# Patient Record
Sex: Male | Born: 1960
Health system: Southern US, Community
[De-identification: ages and names within clinical notes are randomized; demographics above are authoritative.]

## PROBLEM LIST (undated history)

## (undated) DIAGNOSIS — R112 Nausea with vomiting, unspecified: Secondary | ICD-10-CM

## (undated) DIAGNOSIS — Z9889 Other specified postprocedural states: Secondary | ICD-10-CM

## (undated) DIAGNOSIS — F32A Depression, unspecified: Secondary | ICD-10-CM

## (undated) DIAGNOSIS — G4700 Insomnia, unspecified: Secondary | ICD-10-CM

## (undated) DIAGNOSIS — M549 Dorsalgia, unspecified: Secondary | ICD-10-CM

## (undated) DIAGNOSIS — F419 Anxiety disorder, unspecified: Secondary | ICD-10-CM

## (undated) DIAGNOSIS — E079 Disorder of thyroid, unspecified: Secondary | ICD-10-CM

## (undated) HISTORY — PX: VASECTOMY: SHX75

## (undated) HISTORY — PX: COLONOSCOPY: SHX174

## (undated) HISTORY — DX: Anxiety disorder, unspecified: F41.9

## (undated) HISTORY — DX: Disorder of thyroid, unspecified: E07.9

## (undated) HISTORY — DX: Depression, unspecified: F32.A

## (undated) HISTORY — DX: Insomnia, unspecified: G47.00

---

## 2003-01-28 ENCOUNTER — Encounter: Payer: Self-pay | Admitting: Internal Medicine

## 2003-01-28 ENCOUNTER — Encounter: Admission: RE | Admit: 2003-01-28 | Discharge: 2003-01-28 | Payer: Self-pay | Admitting: Internal Medicine

## 2003-11-19 ENCOUNTER — Emergency Department (HOSPITAL_COMMUNITY): Admission: EM | Admit: 2003-11-19 | Discharge: 2003-11-19 | Payer: Self-pay | Admitting: Emergency Medicine

## 2003-12-27 ENCOUNTER — Ambulatory Visit (HOSPITAL_COMMUNITY): Admission: RE | Admit: 2003-12-27 | Discharge: 2003-12-28 | Payer: Self-pay | Admitting: Orthopaedic Surgery

## 2006-05-28 HISTORY — PX: BACK SURGERY: SHX140

## 2007-06-27 ENCOUNTER — Ambulatory Visit: Payer: Self-pay | Admitting: Gastroenterology

## 2007-06-27 LAB — CONVERTED CEMR LAB
ALT: 36 units/L (ref 0–53)
AST: 33 units/L (ref 0–37)
Albumin: 3.8 g/dL (ref 3.5–5.2)
Alkaline Phosphatase: 77 units/L (ref 39–117)
BUN: 12 mg/dL (ref 6–23)
Bilirubin, Direct: 0.2 mg/dL (ref 0.0–0.3)
Calcium: 9.6 mg/dL (ref 8.4–10.5)
Chloride: 104 meq/L (ref 96–112)
Eosinophils Absolute: 0.2 10*3/uL (ref 0.0–0.6)
Eosinophils Relative: 3.3 % (ref 0.0–5.0)
GFR calc Af Amer: 84 mL/min
GFR calc non Af Amer: 69 mL/min
Glucose, Bld: 96 mg/dL (ref 70–99)
MCV: 90.1 fL (ref 78.0–100.0)
Monocytes Relative: 9.5 % (ref 3.0–11.0)
Neutro Abs: 4.1 10*3/uL (ref 1.4–7.7)
Platelets: 204 10*3/uL (ref 150–400)
RBC: 5.29 M/uL (ref 4.22–5.81)
WBC: 7.1 10*3/uL (ref 4.5–10.5)

## 2007-07-01 ENCOUNTER — Ambulatory Visit (HOSPITAL_COMMUNITY): Admission: RE | Admit: 2007-07-01 | Discharge: 2007-07-01 | Payer: Self-pay | Admitting: Gastroenterology

## 2007-07-03 ENCOUNTER — Ambulatory Visit: Payer: Self-pay | Admitting: Cardiology

## 2007-07-09 ENCOUNTER — Ambulatory Visit: Payer: Self-pay | Admitting: Gastroenterology

## 2007-07-09 ENCOUNTER — Encounter: Payer: Self-pay | Admitting: Gastroenterology

## 2010-10-10 NOTE — Assessment & Plan Note (Signed)
[facility]                            CARDIOLOGY OFFICE NOTE   NAME:Leiker, CORT DRAGOO                         MRN:          [id number]  DATE:07/03/2007                            DOB:          Jun 08, 1960    Mr. Kahrs is a 50 year old male who presents for evaluation of chest pain  and for review of risk factors.  He is no prior cardiac history.  Note  he exercise routinely on the elliptical trainer.  He typically does not  have dyspnea on exertion, orthopnea, PND, pedal edema, palpitations,  presyncope, syncope or exertional chest pain.  He does state he  occasionally feels some discomfort his chest after lying flat on all  night.  There is a soreness.  It is not radiating is not otherwise  present.  Because of the above we were asked to further evaluate.  He  also wanted to have his risk factors reviewed.  His past medical  history.  There is no diabetes mellitus, hypertension.  States  cholesterol runs of the 110-120 range.  He has had some problems  constipation and seeing gastroenterology at present.  He has had an anal  fissure.  He has had previous back surgery.  He has no known drug  allergies.  His family history is significant for his father who had  three myocardial infarctions at age 27.   SOCIAL HISTORY:  Does not smoke nor does he consume alcohol.   REVIEW OF SYSTEMS:  He denies any headaches or fevers, chills.  Has no  productive cough or hemoptysis.  There is no dysphagia, odynophagia or  melena.  He has had hematochezia but has been attributes to anal fissure  and undergoing colonoscopy near future for this issue.  Note he has had  some constipation.  There is no dysuria inch of his or seizure activity.  There is no orthopnea, PND or pedal edema.  The remaining systems are  negative.   PHYSICAL EXAM:  Today shows a blood pressure of 125/88 and his pulse 67.  Weighs 319 pounds.  He is well-developed and obese.  He is no acute distress.   Skin is warm,  dry.  Not depressed.  There is no peripheral clubbing.  BACK:  Normal.  His HEENT is normal with normal eyelids.  Neck is supple with normal upstroke bilaterally.  I cannot appreciate  bruits.  There is no jugular distention and no thyromegaly is noted.  CHEST:  Clear.  Normal expansion.  CARDIOVASCULAR:  Regular rhythm.  Normal S1-S2.  No murmurs, rubs or  gallops noted.  There is no change Valsalva.  ABDOMEN:  Exam nontender, and possible sounds.  No mass appreciated.  There is no abdominal bruit.  He has 2+ femoral pulses bilaterally.  No  bruits.  EXTREMITIES:  Shows no edema.  No cords.  2+ posterior tibial pulses  bilaterally.  NEUROLOGIC:  Grossly intact.   His electrocardiogram today shows a sinus rhythm at a rate of 60.  The  axis is normal.  No ST changes noted.   DIAGNOSES:  1. Vague  atypical chest pain - this occurs only with lying on his      chest is musculoskeletal.  Do not think this requires further      cardiac workup.  I did offer stress test today as the patient was      concerned about family history.  However, he has no other risk      factors including no diabetes, hypertension, hyperlipidemia or      tobacco abuse.  His electrocardiogram is normal.  He also exercises      routinely with no symptoms.  I therefore do not think he needs      further workup and he has declined offer of the treadmill.  2. Obesity - we discussed the importance of diet and exercise to help      with his weight loss.  He will continue with risk factor      modification.  We will see him back on as-needed basis.     Madolyn Frieze Jens Som, MD, [facility]  Electronically Signed    BSC/MedQ  DD: 07/03/2007  DT: 07/04/2007  Job #: [id number]   cc:   Tammy R. Collins Scotland, M.D.

## 2010-10-10 NOTE — Assessment & Plan Note (Signed)
[facility]                         GASTROENTEROLOGY OFFICE NOTE   NAME:Bean, Miguel BRICKLE                         MRN:          [id number]  DATE:06/27/2007                            DOB:          13-Nov-1960    Miguel Bean is a 50 year old white male, referred through the courtesy of  Dr. Collins Scotland for recent guaiac-positive stools and intermittent rectal  bleeding.   Christina has had intermittent rectal bleeding for the last year and has  been diagnosed previously as having an anal fissure.  He also has  chronic functional constipation, has been on MiraLax and p.r.n. anal  creams for the last year and has been doing well, without  symptomatology, except for occasional bright red blood per rectum and  some mucus in his stool.  Recent examination in Dr. Alda Berthold clinic  showed a guaiac-positive stool, but otherwise negative rectal exam.   The patient currently is on a high-fiber diet and is doing fairly well  with his bowel movements.  He denies GI symptomatology.  He denies  significant acid reflux, any history of melena, or any hepatobiliary  complaints, anorexia, weight-loss, or any specific food intolerances.  He does work very irregular hours and has some problems eating on a  regular basis.  He has never had colonoscopy, endoscopy or barium  studies of his gastrointestinal tract.   PAST MEDICAL HISTORY:  Surprisingly unremarkable, except for a history  of laminectomy by Dr. Annell Greening in August of 2005.   MEDICATIONS:  1. Folic acid daily.  2. Synthroid for chronic thyroid dysfunction, 25 mcg a day.  3. MiraLax p.r.n.   ALLERGIES:  He denies drug allergies.   FAMILY HISTORY:  Remarkable for sigmoid resection for some type of  unknown gastrointestinal problem in his father.   SOCIAL HISTORY:  He is married, lives with his wife and works as an  Art gallery manager.  He has a high school education.  He does not smoke or abuse  ethanol.   REVIEW OF SYSTEMS:   Otherwise noncontributory, without any current  cardiovascular, pulmonary, genitourinary, neurologic, orthopedic,  endocrine or psychiatric difficulties.   EXAM:  He is a healthy-appearing white male in no distress, appearing  his stated age.  He is 6 feet 1 inch tall and weighs 318 pounds.  Blood pressure is  116/74 and pulse was 64 and regular.  I could not appreciate stigmata of chronic liver disease.  ABDOMEN:  Obese and there was definite organomegaly with enlarged liver  in the right upper quadrant, but no definite splenomegaly, other  abdominal masses or tenderness.  Bowel sounds were normal.  Inspection of the rectum was unremarkable.  I did not repeat his digital  exam.  MENTAL STATUS:  Clear.   ASSESSMENT:  1. Chronic functional constipation and use of MiraLax in a patient who      does have chronic thyroid dysfunction.  2. Intermittent rectal bleeding.  Rule out colon polyps, versus      internal hemorrhoids, and recurrent bleeding rectal fissures.  3. Chronic thyroid dysfunction.  4. Rather massive obesity and probable fatty infiltration  of the      liver.   RECOMMENDATIONS:  1. Outpatient upper abdominal ultrasound exam.  2. Outpatient colonoscopy at his convenience.  3. Check CBC and liver profile.  4. Continue other medications as per Dr. Collins Scotland.     Vania Rea. Jarold Motto, MD, [facility], FAGA  Electronically Signed    DRP/MedQ  DD: 06/27/2007  DT: 06/27/2007  Job #: [id number]   cc:   Tammy R. Collins Scotland, M.D.

## 2010-10-13 NOTE — Op Note (Signed)
NAME:  Miguel Bean, Miguel Bean                            ACCOUNT NO.:  [id number]   MEDICAL RECORD NO.:  [id number]                   PATIENT TYPE:  OIB   LOCATION:  2899                                 FACILITY:  [facility]   PHYSICIAN:  Mark C. Ophelia Charter, M.D.                 DATE OF BIRTH:  [birth year]   DATE OF PROCEDURE:  12/27/2003  DATE OF DISCHARGE:                                 OPERATIVE REPORT   PREOPERATIVE DIAGNOSIS:  Left L4-5 herniated nucleus pulposus with free  fragment.   POSTOPERATIVE DIAGNOSIS:  Left L4-5 herniated nucleus pulposus with free  fragment.   OPERATION PERFORMED:  Left L4-5 microdiskectomy with removal of free  fragment.   SURGEON:  Mark C. Ophelia Charter, M.D.   ASSISTANT:  Sandrea Matte, P.A.   ANESTHESIA:  General orotracheal anesthesia.   ESTIMATED BLOOD LOSS:  200 mL.   DRAINS:  None.   DESCRIPTION OF PROCEDURE:  After induction of general anesthesia,  orotracheal intubation, placed upon Andrews frame.  Preoperative Ancef  prophylaxis.  Back was prepped with DuraPrep.  Area was squared with towels.  Betadine Vi-drape applied and laminectomy sheet and drapes were applied.  Needle localization with spinal needle.  X-ray was taken showing the needle  was just above the 4-5 space.  The patient weighed 300 pounds and required a  double exposure.  Incision was made starting at the needle extending  distally, just left of the spinous processes by a few millimeters.  Dissection through several inches of adipose tissue was performed until the  fascia was identified.  This was released, periosteally stripped and an  extra deep Taylor retractor was placed laterally.  Laminotomy was performed.  Top portion of the L5 on the left was taken and large fragment was  identified which was in the axilla and a fibrous pocket with the disk  material  being very friable, easily removed with the suction.  Several  large pieces were teased out until the sac was completely removed.   There  was some underneath the nerve root as it exited the foramina which also had  to be removed and then finally the shoulder of the nerve root was able to be  dissected and the nerve root pulled toward the midline and removed with some  additional fragments from above the nerve root.  Passes were made through  the disk space with up and down straight pituitaries.  Once the dura was  well decompressed, hockey stick could be passed anterior to the dura 180  degrees with no areas of compression, no remaining free fragment was  present.  The pocket where it sat against the body of L5 was completely  visualized.  The nerve root was completely free and a hockey stick could be  passed up the foramina with no areas of compression.  The lateral gutter out  to the pedicle was smooth.  Disk  space then irrigated with saline solution,  0 Vicryl in the deep fascia, 2-0 Vicryl in the subcutaneous tissue.  Skin  staple closure.  Marcaine infiltration, Adaptic, 4 x 4s and tape.  Instrument and needle counts were correct.                                               Mark C. Ophelia Charter, M.D.   MCY/MEDQ  D:  12/27/2003  T:  12/27/2003  Job:  [id number]

## 2011-06-07 ENCOUNTER — Encounter (HOSPITAL_COMMUNITY): Payer: Self-pay | Admitting: Respiratory Therapy

## 2011-06-11 ENCOUNTER — Other Ambulatory Visit (HOSPITAL_COMMUNITY): Payer: Self-pay | Admitting: Orthopaedic Surgery

## 2011-06-15 ENCOUNTER — Encounter (HOSPITAL_COMMUNITY): Payer: Self-pay

## 2011-06-15 ENCOUNTER — Encounter (HOSPITAL_COMMUNITY)
Admission: RE | Admit: 2011-06-15 | Discharge: 2011-06-15 | Disposition: A | Discharge status: Home / Self Care | Payer: 59 | Source: Ambulatory Visit | Attending: Orthopaedic Surgery | Admitting: Orthopaedic Surgery

## 2011-06-15 ENCOUNTER — Other Ambulatory Visit: Payer: Self-pay

## 2011-06-15 HISTORY — DX: Other specified postprocedural states: Z98.890

## 2011-06-15 HISTORY — DX: Other specified postprocedural states: R11.2

## 2011-06-15 LAB — COMPREHENSIVE METABOLIC PANEL
ALT: 18 U/L (ref 0–53)
BUN: 14 mg/dL (ref 6–23)
CO2: 27 mEq/L (ref 19–32)
Calcium: 9.3 mg/dL (ref 8.4–10.5)
Creatinine, Ser: 0.87 mg/dL (ref 0.50–1.35)
GFR calc Af Amer: >90 mL/min (ref >90)
GFR calc non Af Amer: >90 mL/min (ref >90)
Glucose, Bld: 75 mg/dL (ref 70–99)
Sodium: 141 mEq/L (ref 135–145)
Total Protein: 6.7 g/dL (ref 6.0–8.3)

## 2011-06-15 LAB — CBC
HCT: 45.2 % (ref 39.0–52.0)
Hemoglobin: 15.7 g/dL (ref 13.0–17.0)
MCH: 31.5 pg (ref 26.0–34.0)
MCHC: 34.7 g/dL (ref 30.0–36.0)
MCV: 90.8 fL (ref 78.0–100.0)
RBC: 4.98 MIL/uL (ref 4.22–5.81)

## 2011-06-15 LAB — SURGICAL PCR SCREEN: MRSA, PCR: NEGATIVE

## 2011-06-15 NOTE — Pre-Procedure Instructions (Addendum)
[address] Karbowski  06/15/2011   Your procedure is scheduled on:  06/22/11  Report to [facility] at530 AM.  Call this number if you have problems the morning of surgery: [phone]   Remember:   Do not eat food:After Midnight.  May have clear liquids: up to 4 Hours before arrival.  Clear liquids include soda, tea, black coffee, apple or grape juice, broth.  Take these medicines the morning of surgery with A SIP OF WATER:   Do not wear jewelry, make-up or nail polish.  Do not wear lotions, powders, or perfumes. You may wear deodorant.  Do not shave 48 hours prior to surgery.  Do not bring valuables to the hospital.  Contacts, dentures or bridgework may not be worn into surgery.  Leave suitcase in the car. After surgery it may be brought to your room.  For patients admitted to the hospital, checkout time is 11:00 AM the day of discharge.   Patients discharged the day of surgery will not be allowed to drive home.  Name and phone number of your driver:brenda [phone]  Special Instructions: CHG Shower Use Special Wash: 1/2 bottle night before surgery and 1/2 bottle morning of surgery.   Please read over the following fact sheets that you were given: Pain Booklet, Coughing and Deep Breathing, MRSA Information and Surgical Site Infection Prevention

## 2011-06-18 LAB — URINALYSIS, ROUTINE W REFLEX MICROSCOPIC
Glucose, UA: NEGATIVE mg/dL
Hgb urine dipstick: NEGATIVE
Ketones, ur: NEGATIVE mg/dL
Leukocytes, UA: NEGATIVE
Protein, ur: NEGATIVE mg/dL
Urobilinogen, UA: 0.2 mg/dL (ref 0.0–1.0)

## 2011-06-21 MED ORDER — CEFAZOLIN SODIUM-DEXTROSE 2-3 GM-% IV SOLR
2.0000 g | INTRAVENOUS | Status: AC
  Administered 2011-06-22: 2 g via INTRAVENOUS
  Filled 2011-06-21: qty 50

## 2011-06-22 ENCOUNTER — Encounter (HOSPITAL_COMMUNITY)
Admission: RE | Disposition: A | Discharge status: Home / Self Care | Payer: Self-pay | Source: Ambulatory Visit | Attending: Orthopaedic Surgery

## 2011-06-22 ENCOUNTER — Encounter (HOSPITAL_COMMUNITY): Payer: Self-pay | Admitting: *Deleted

## 2011-06-22 ENCOUNTER — Encounter (HOSPITAL_COMMUNITY): Payer: Self-pay | Admitting: Anesthesiology

## 2011-06-22 ENCOUNTER — Ambulatory Visit (HOSPITAL_COMMUNITY): Payer: 59

## 2011-06-22 ENCOUNTER — Ambulatory Visit (HOSPITAL_COMMUNITY)
Admission: RE | Admit: 2011-06-22 | Discharge: 2011-06-23 | Disposition: A | Discharge status: Home / Self Care | Payer: 59 | Source: Ambulatory Visit | Attending: Orthopaedic Surgery | Admitting: Orthopaedic Surgery

## 2011-06-22 ENCOUNTER — Ambulatory Visit (HOSPITAL_COMMUNITY): Payer: 59 | Admitting: Anesthesiology

## 2011-06-22 DIAGNOSIS — Z01818 Encounter for other preprocedural examination: Secondary | ICD-10-CM | POA: Insufficient documentation

## 2011-06-22 DIAGNOSIS — Z01812 Encounter for preprocedural laboratory examination: Secondary | ICD-10-CM | POA: Insufficient documentation

## 2011-06-22 DIAGNOSIS — M5126 Other intervertebral disc displacement, lumbar region: Secondary | ICD-10-CM

## 2011-06-22 DIAGNOSIS — Z01810 Encounter for preprocedural cardiovascular examination: Secondary | ICD-10-CM | POA: Insufficient documentation

## 2011-06-22 HISTORY — DX: Dorsalgia, unspecified: M54.9

## 2011-06-22 HISTORY — PX: LUMBAR LAMINECTOMY: SHX95

## 2011-06-22 SURGERY — MICRODISCECTOMY LUMBAR LAMINECTOMY
Anesthesia: General | Site: Back | Laterality: Left | Wound class: Clean

## 2011-06-22 MED ORDER — SODIUM CHLORIDE 0.9 % IJ SOLN: 3.0000 mL | INTRAMUSCULAR | Status: DC | PRN

## 2011-06-22 MED ORDER — SODIUM CHLORIDE 0.9 % IV SOLN: 250.0000 mL | INTRAVENOUS | Status: DC

## 2011-06-22 MED ORDER — MENTHOL 3 MG MT LOZG: 1.0000 | LOZENGE | OROMUCOSAL | Status: DC | PRN

## 2011-06-22 MED ORDER — MIDAZOLAM HCL 5 MG/5ML IJ SOLN
INTRAMUSCULAR | Status: DC | PRN
  Administered 2011-06-22 (×2): 1 mg via INTRAVENOUS

## 2011-06-22 MED ORDER — BISACODYL 10 MG RE SUPP: 10.0000 mg | Freq: Every day | RECTAL | Status: DC | PRN

## 2011-06-22 MED ORDER — HYDROCODONE-ACETAMINOPHEN 5-325 MG PO TABS: 1.0000 | ORAL_TABLET | ORAL | Status: DC | PRN

## 2011-06-22 MED ORDER — SODIUM CHLORIDE 0.9 % IJ SOLN
3.0000 mL | Freq: Two times a day (BID) | INTRAMUSCULAR | Status: DC
  Administered 2011-06-22 – 2011-06-23 (×2): 3 mL via INTRAVENOUS

## 2011-06-22 MED ORDER — METHOCARBAMOL 500 MG PO TABS: 500.0000 mg | ORAL_TABLET | Freq: Four times a day (QID) | ORAL | Status: DC | PRN

## 2011-06-22 MED ORDER — ONDANSETRON HCL 4 MG/2ML IJ SOLN
INTRAMUSCULAR | Status: DC | PRN
  Administered 2011-06-22: 4 mg via INTRAVENOUS

## 2011-06-22 MED ORDER — ALUM & MAG HYDROXIDE-SIMETH 200-200-20 MG/5ML PO SUSP: 30.0000 mL | Freq: Four times a day (QID) | ORAL | Status: DC | PRN

## 2011-06-22 MED ORDER — PROPOFOL 10 MG/ML IV EMUL
INTRAVENOUS | Status: DC | PRN
  Administered 2011-06-22: 200 mg via INTRAVENOUS

## 2011-06-22 MED ORDER — ACETAMINOPHEN 325 MG PO TABS: 650.0000 mg | ORAL_TABLET | ORAL | Status: DC | PRN

## 2011-06-22 MED ORDER — BUPIVACAINE HCL (PF) 0.25 % IJ SOLN
INTRAMUSCULAR | Status: DC | PRN
  Administered 2011-06-22: 6 mL

## 2011-06-22 MED ORDER — NEOSTIGMINE METHYLSULFATE 1 MG/ML IJ SOLN
INTRAMUSCULAR | Status: DC | PRN
  Administered 2011-06-22: 3 mg via INTRAVENOUS

## 2011-06-22 MED ORDER — HYDROMORPHONE HCL PF 1 MG/ML IJ SOLN: 0.2500 mg | INTRAMUSCULAR | Status: DC | PRN

## 2011-06-22 MED ORDER — OXYCODONE-ACETAMINOPHEN 5-325 MG PO TABS: 1.0000 | ORAL_TABLET | ORAL | Status: DC | PRN

## 2011-06-22 MED ORDER — KETOROLAC TROMETHAMINE 30 MG/ML IJ SOLN
30.0000 mg | Freq: Four times a day (QID) | INTRAMUSCULAR | Status: DC
  Administered 2011-06-22 – 2011-06-23 (×3): 30 mg via INTRAVENOUS
  Filled 2011-06-22 (×7): qty 1

## 2011-06-22 MED ORDER — PHENYLEPHRINE HCL 10 MG/ML IJ SOLN
10.0000 mg | INTRAVENOUS | Status: DC | PRN
  Administered 2011-06-22: 20 ug/min via INTRAVENOUS

## 2011-06-22 MED ORDER — ACETAMINOPHEN 650 MG RE SUPP: 650.0000 mg | RECTAL | Status: DC | PRN

## 2011-06-22 MED ORDER — ONDANSETRON HCL 4 MG/2ML IJ SOLN: 4.0000 mg | Freq: Four times a day (QID) | INTRAMUSCULAR | Status: DC | PRN

## 2011-06-22 MED ORDER — PHENOL 1.4 % MT LIQD
1.0000 | OROMUCOSAL | Status: DC | PRN
  Filled 2011-06-22: qty 177

## 2011-06-22 MED ORDER — ONDANSETRON HCL 4 MG/2ML IJ SOLN: 4.0000 mg | INTRAMUSCULAR | Status: DC | PRN

## 2011-06-22 MED ORDER — DEXTROSE 5 % IV SOLN
INTRAVENOUS | Status: DC | PRN
  Administered 2011-06-22: 07:00:00 via INTRAVENOUS

## 2011-06-22 MED ORDER — DOCUSATE SODIUM 100 MG PO CAPS
100.0000 mg | ORAL_CAPSULE | Freq: Two times a day (BID) | ORAL | Status: DC
  Administered 2011-06-22 – 2011-06-23 (×3): 100 mg via ORAL
  Filled 2011-06-22 (×3): qty 1

## 2011-06-22 MED ORDER — EPHEDRINE SULFATE 50 MG/ML IJ SOLN
INTRAMUSCULAR | Status: DC | PRN
  Administered 2011-06-22 (×3): 5 mg via INTRAVENOUS

## 2011-06-22 MED ORDER — 0.9 % SODIUM CHLORIDE (POUR BTL) OPTIME
TOPICAL | Status: DC | PRN
  Administered 2011-06-22: 1000 mL

## 2011-06-22 MED ORDER — DEXAMETHASONE SODIUM PHOSPHATE 10 MG/ML IJ SOLN
INTRAMUSCULAR | Status: DC | PRN
  Administered 2011-06-22: 10 mg via INTRAVENOUS

## 2011-06-22 MED ORDER — SENNOSIDES-DOCUSATE SODIUM 8.6-50 MG PO TABS: 1.0000 | ORAL_TABLET | Freq: Every evening | ORAL | Status: DC | PRN

## 2011-06-22 MED ORDER — ZOLPIDEM TARTRATE 10 MG PO TABS: 10.0000 mg | ORAL_TABLET | Freq: Every evening | ORAL | Status: DC | PRN

## 2011-06-22 MED ORDER — ROCURONIUM BROMIDE 100 MG/10ML IV SOLN
INTRAVENOUS | Status: DC | PRN
  Administered 2011-06-22 (×2): 10 mg via INTRAVENOUS
  Administered 2011-06-22: 50 mg via INTRAVENOUS

## 2011-06-22 MED ORDER — GLYCOPYRROLATE 0.2 MG/ML IJ SOLN
INTRAMUSCULAR | Status: DC | PRN
  Administered 2011-06-22: .4 mg via INTRAVENOUS

## 2011-06-22 MED ORDER — MORPHINE SULFATE 4 MG/ML IJ SOLN: 1.0000 mg | INTRAMUSCULAR | Status: DC | PRN

## 2011-06-22 MED ORDER — CEFAZOLIN SODIUM 1-5 GM-% IV SOLN
1.0000 g | Freq: Three times a day (TID) | INTRAVENOUS | Status: AC
  Administered 2011-06-22 (×2): 1 g via INTRAVENOUS
  Filled 2011-06-22 (×2): qty 50

## 2011-06-22 MED ORDER — LACTATED RINGERS IV SOLN
INTRAVENOUS | Status: DC | PRN
  Administered 2011-06-22 (×2): via INTRAVENOUS

## 2011-06-22 MED ORDER — METHOCARBAMOL 100 MG/ML IJ SOLN
500.0000 mg | Freq: Four times a day (QID) | INTRAVENOUS | Status: DC | PRN
  Filled 2011-06-22: qty 5

## 2011-06-22 MED ORDER — KCL IN DEXTROSE-NACL 20-5-0.45 MEQ/L-%-% IV SOLN
INTRAVENOUS | Status: DC
  Administered 2011-06-22: 75 mL/h via INTRAVENOUS
  Filled 2011-06-22 (×5): qty 1000

## 2011-06-22 MED ORDER — FENTANYL CITRATE 0.05 MG/ML IJ SOLN
INTRAMUSCULAR | Status: DC | PRN
  Administered 2011-06-22: 100 ug via INTRAVENOUS
  Administered 2011-06-22: 50 ug via INTRAVENOUS

## 2011-06-22 SURGICAL SUPPLY — 52 items
BENZOIN TINCTURE PRP APPL 2/3 (GAUZE/BANDAGES/DRESSINGS) ×2 IMPLANT
BUR ROUND FLUTED 4 SOFT TCH (BURR) IMPLANT
CLOTH BEACON ORANGE TIMEOUT ST (SAFETY) ×2 IMPLANT
CORDS BIPOLAR (ELECTRODE) ×2 IMPLANT
COVER SURGICAL LIGHT HANDLE (MISCELLANEOUS) ×2 IMPLANT
DRAPE MICROSCOPE LEICA (MISCELLANEOUS) ×2 IMPLANT
DRAPE PROXIMA HALF (DRAPES) IMPLANT
DRSG EMULSION OIL 3X3 NADH (GAUZE/BANDAGES/DRESSINGS) IMPLANT
DURAPREP 26ML APPLICATOR (WOUND CARE) ×2 IMPLANT
ELECT REM PT RETURN 9FT ADLT (ELECTROSURGICAL) ×2
ELECTRODE REM PT RTRN 9FT ADLT (ELECTROSURGICAL) ×1 IMPLANT
GAUZE SPONGE 4X4 12PLY STRL LF (GAUZE/BANDAGES/DRESSINGS) ×2 IMPLANT
GLOVE BIOGEL PI IND STRL 6.5 (GLOVE) ×1 IMPLANT
GLOVE BIOGEL PI IND STRL 7.5 (GLOVE) ×1 IMPLANT
GLOVE BIOGEL PI IND STRL 8 (GLOVE) ×1 IMPLANT
GLOVE BIOGEL PI INDICATOR 6.5 (GLOVE) ×1
GLOVE BIOGEL PI INDICATOR 7.5 (GLOVE) ×1
GLOVE BIOGEL PI INDICATOR 8 (GLOVE) ×1
GLOVE ECLIPSE 7.0 STRL STRAW (GLOVE) ×2 IMPLANT
GLOVE ORTHO TXT STRL SZ7.5 (GLOVE) ×4 IMPLANT
GLOVE SURG SS PI 6.5 STRL IVOR (GLOVE) ×2 IMPLANT
GOWN BRE IMP SLV AUR XL STRL (GOWN DISPOSABLE) ×4 IMPLANT
GOWN PREVENTION PLUS LG XLONG (DISPOSABLE) IMPLANT
GOWN STRL NON-REIN LRG LVL3 (GOWN DISPOSABLE) ×2 IMPLANT
KIT BASIN OR (CUSTOM PROCEDURE TRAY) ×2 IMPLANT
KIT ROOM TURNOVER OR (KITS) ×2 IMPLANT
MANIFOLD NEPTUNE II (INSTRUMENTS) ×2 IMPLANT
NDL SUT .5 MAYO 1.404X.05X (NEEDLE) ×1 IMPLANT
NEEDLE HYPO 25GX1X1/2 BEV (NEEDLE) ×2 IMPLANT
NEEDLE MAYO TAPER (NEEDLE) ×1
NEEDLE SPNL 18GX3.5 QUINCKE PK (NEEDLE) ×2 IMPLANT
NS IRRIG 1000ML POUR BTL (IV SOLUTION) ×2 IMPLANT
PACK LAMINECTOMY ORTHO (CUSTOM PROCEDURE TRAY) ×2 IMPLANT
PAD ARMBOARD 7.5X6 YLW CONV (MISCELLANEOUS) ×4 IMPLANT
PATTIES SURGICAL .5 X.5 (GAUZE/BANDAGES/DRESSINGS) IMPLANT
PATTIES SURGICAL .75X.75 (GAUZE/BANDAGES/DRESSINGS) IMPLANT
SPONGE GAUZE 4X4 12PLY (GAUZE/BANDAGES/DRESSINGS) IMPLANT
STAPLER VISISTAT 35W (STAPLE) IMPLANT
STRIP CLOSURE SKIN 1/2X4 (GAUZE/BANDAGES/DRESSINGS) ×2 IMPLANT
SUCTION FRAZIER TIP 10 FR DISP (SUCTIONS) ×2 IMPLANT
SUT VIC AB 0 CT1 27 (SUTURE) ×1
SUT VIC AB 0 CT1 27XBRD ANBCTR (SUTURE) ×1 IMPLANT
SUT VIC AB 2-0 CT1 27 (SUTURE)
SUT VIC AB 2-0 CT1 TAPERPNT 27 (SUTURE) IMPLANT
SUT VICRYL 0 TIES 12 18 (SUTURE) ×2 IMPLANT
SUT VICRYL 4-0 PS2 18IN ABS (SUTURE) IMPLANT
SUT VICRYL AB 2 0 TIES (SUTURE) IMPLANT
SYR 20ML ECCENTRIC (SYRINGE) IMPLANT
SYR CONTROL 10ML LL (SYRINGE) ×2 IMPLANT
TOWEL OR 17X24 6PK STRL BLUE (TOWEL DISPOSABLE) ×2 IMPLANT
TOWEL OR 17X26 10 PK STRL BLUE (TOWEL DISPOSABLE) ×2 IMPLANT
WATER STERILE IRR 1000ML POUR (IV SOLUTION) IMPLANT

## 2011-06-22 NOTE — Anesthesia Preprocedure Evaluation (Addendum)
Anesthesia Evaluation  Patient identified by MRN, date of birth, ID band Patient awake    Reviewed: Allergy & Precautions, H&P , NPO status , Patient's Chart, lab work & pertinent test results  History of Anesthesia Complications (+) PONV  Airway Mallampati: II  Neck ROM: full    Dental   Pulmonary          Cardiovascular     Neuro/Psych    GI/Hepatic   Endo/Other  Morbid obesity  Renal/GU      Musculoskeletal   Abdominal   Peds  Hematology   Anesthesia Other Findings   Reproductive/Obstetrics                          Anesthesia Physical Anesthesia Plan  ASA: II  Anesthesia Plan: General   Post-op Pain Management:    Induction: Intravenous  Airway Management Planned: Oral ETT  Additional Equipment:   Intra-op Plan:   Post-operative Plan: Extubation in OR  Informed Consent: I have reviewed the patients History and Physical, chart, labs and discussed the procedure including the risks, benefits and alternatives for the proposed anesthesia with the patient or authorized representative who has indicated his/her understanding and acceptance.   Dental advisory given  Plan Discussed with: Anesthesiologist and Surgeon  Anesthesia Plan Comments:         Anesthesia Quick Evaluation

## 2011-06-22 NOTE — Transfer of Care (Signed)
Immediate Anesthesia Transfer of Care Note  Patient: Miguel Bean  Procedure(s) Performed:  MICRODISCECTOMY LUMBAR LAMINECTOMY - Left L5-S1 Microdiscectomy  Patient Location: PACU  Anesthesia Type: General  Level of Consciousness: awake and alert   Airway & Oxygen Therapy: Patient Spontanous Breathing and Patient connected to nasal cannula oxygen  Post-op Assessment: Report given to PACU RN, Post -op Vital signs reviewed and stable and Patient moving all extremities  Post vital signs: Reviewed and stable  Complications: No apparent anesthesia complications

## 2011-06-22 NOTE — Op Note (Signed)
Miguel Bean, Miguel Bean                  ACCOUNT NO.:  [id number]  MEDICAL RECORD NO.:  [id number]  LOCATION:  [facility]                         FACILITY:  [facility]  PHYSICIAN:  Tanis Burnley C. Ophelia Charter, M.D.    DATE OF BIRTH:  [birth year]  DATE OF PROCEDURE:  06/22/2011 DATE OF DISCHARGE:                              OPERATIVE REPORT   PREOPERATIVE DIAGNOSIS:  Left foraminal, extraforaminal herniated nucleus pulposus, L5-S1.  POSTOPERATIVE DIAGNOSIS:  Left foraminal, extraforaminal herniated nucleus pulposus, L5-S1.  PROCEDURE:  Left L5-S1 microdiskectomy.  SURGEON:  Laurencia Roma C. Ophelia Charter, MD.  ASSISTANT:  Maud Deed PA-C, medically necessary and present for the entire procedure.  ANESTHESIA:  GOT plus Marcaine skin local.  ESTIMATED BLOOD LOSS:  Minimal.  This patient has had previous surgery at L4-5 on the left, and has had problems with the back pain, leg pain, leg weakness, failed epidurals, anti-inflammatories, takes narcotic medication, physical therapy with increasing symptoms and L5-S1 enlargement of foraminal, extraforaminal HNP.  He has some degenerative changes at other levels including L4-5 with some disk protrusion, left paracentral that might be responsible portion of his problem.  PROCEDURE:  After induction of general anesthesia and orotracheal intubation, the patient was placed prone on chest rolls.  Standard prepping and draping was performed and the usual sterile towels, sterile skin marker on the old incision, and Betadine Steri-Drape.  Laminectomy sheets and drapes were applied.  Spinal needle was placed at the planned level and confirmed with an x-ray.  Incision was made, subperiosteal dissection, extra deep Taylor retractor had been used due to the patient's large size with thick layer of adipose tissue.  A #4 Penfield was placed at L5-S 1 level.  Second x-ray was taken confirming appropriate level.  Laminotomy was performed.  Ligament was taken down. Disk was bulging and  particularly prominent in the foramina.  There was overhanging facets and the hockey stick would barely fit out laterally on the left in line with the disk due to overhanging facets and the disk bulge.  With anulus incised, I used combination of micro pituitaries down, up biter straight pituitaries and Epstein.  Disk material was carefully pulled back and toward the mid portion of the disk, I grasped and removed.  Undercutting of the facet was performed to enlarge the area.  Bone was removed out to the level of the S1 pedicle and a hockey stick was placed around the S1 nerve root with no areas compression. Continued work in the midline, decompressed the L5-S1 disk little bit more and then continued work out laterally with the hockey stick, pushing fragments in to the air of the disk and then grasped them with the down biting micropituitary and removal.  After irrigation with saline solution, MRI was reviewed again.  With the patient's multilevel degenerative changes, an option to consider doing a hemilaminectomy on the left, taking a look at the L4-5 disk, was discussed.  L4-5 disk had been stable with no change on serial MRIs of the L5-S1 that showed increase of the foraminal, extraforaminal disk herniation with further displacement of the nerve root.  It was elected to stay with the microdiskectomy at the operative L5-S1 level as  planned.  Another level had had surgery before and there was a scar tissue present and absence of progressive compression on sterile MRI, it was felt that more likely the L5-S1 level had been a cause for his symptoms and this corresponded with his response to epidural injections.  Operative field was then irrigated carefully.  Fascia was closed with 0 Vicryl, 2-0 Vicryl, subcutaneous tissue, subcuticular Vicryl for skin closure.  Tincture of benzoin, Marcaine infiltration, Steri-Strips and postoperative dressing. Instrument count and needle count was correct.   The patient tolerated the procedure well and was transferred to recovery room in stable condition.     Amneet Cendejas C. Ophelia Charter, M.D.     MCY/MEDQ  D:  06/22/2011  T:  06/22/2011  Job:  [id number]

## 2011-06-22 NOTE — Brief Op Note (Cosign Needed Addendum)
06/22/2011  9:23 AM  PATIENT:  Miguel Bean  51 y.o. male  PRE-OPERATIVE DIAGNOSIS:  Left L5-S1 herniated nucleus pulposus  POST-OPERATIVE DIAGNOSIS:  Left L5-S1 herniated nucleus pulposus  PROCEDURE:  Procedure(s): MICRODISCECTOMY LUMBAR LAMINECTOMY  SURGEON:  Surgeon(s): Eldred Manges, MD  PHYSICIAN ASSISTANT: Maud Deed PAC  ASSISTANTS: none   ANESTHESIA:   general  EBL:  Total I/O In: 1050 [I.V.:1050] Out: 50 [Blood:50]  BLOOD ADMINISTERED:none  DRAINS: none   LOCAL MEDICATIONS USED:  MARCAINE 10 CC  SPECIMEN:  No Specimen  DISPOSITION OF SPECIMEN:  N/A  COUNTS:  YES  TOURNIQUET:  * No tourniquets in log *  DICTATION: .Note written in EPIC and Other Dictation: Dictation Number [id number]  PLAN OF CARE: Admit for overnight observation  PATIENT DISPOSITION:  PACU - hemodynamically stable.   Delay start of Pharmacological VTE agent (>24hrs) due to surgical blood loss or risk of bleeding:  {YES/NO/NOT APPLICABLE:20182

## 2011-06-22 NOTE — H&P (Signed)
Miguel Bean is an 51 y.o. male.   Chief Complaint: LBP and left pain and weakness  Left L5-S1 HNP with compression and radiculopathy [id number] conservative Tx NSAIDS, muscle relaxant , PT , ESI's narcotic meds pain with ADL's , problems with work secondary to pain and left leg weakness.  Previous lum lam at L4-5  Past Medical History  Diagnosis Date  . PONV (postoperative nausea and vomiting)   . Back pain     Past Surgical History  Procedure Date  . Back surgery 08  . Vasectomy     96    History reviewed. No pertinent family history. Social History:  reports that he has never smoked. He does not have any smokeless tobacco history on file. He reports that he does not drink alcohol or use illicit drugs.  Allergies: No Known Allergies  Medications Prior to Admission  Medication Dose Route Frequency Provider Last Rate Last Dose  . ceFAZolin (ANCEF) IVPB 2 g/50 mL premix  2 g Intravenous 60 min Pre-Op Eldred Manges, MD       No current outpatient prescriptions on file as of 06/22/2011.    No results found for this or any previous visit (from the past 48 hour(s)). No results found.  Review of Systems  Constitutional: Negative for fever and weight loss.  HENT: Negative for hearing loss, congestion and ear discharge.   Eyes: Negative for blurred vision, photophobia and pain.  Respiratory: Negative for hemoptysis.   Cardiovascular: Negative for chest pain and orthopnea.  Gastrointestinal: Negative for heartburn, nausea and vomiting.  Genitourinary: Negative.   Musculoskeletal: Positive for back pain.  Skin: Negative for itching and rash.  Neurological: Positive for tingling.       Lumb lateral and plantar left foot  Psychiatric/Behavioral: Negative.  Negative for depression.    Blood pressure 123/77, pulse 60, temperature 98.4 F (36.9 C), temperature source Oral, resp. rate 18, SpO2 98.00%. Physical Exam  Constitutional: He appears well-developed.  HENT:  Head:  Normocephalic and atraumatic.  Eyes: EOM are normal. Pupils are equal, round, and reactive to light.  Neck: Normal range of motion. Neck supple.  Cardiovascular: Normal rate and regular rhythm.   Respiratory: Effort normal and breath sounds normal.  GI: Soft. Bowel sounds are normal.  Musculoskeletal:       Pos. SLR 45 degrees left , GS weakness left.  MRI HNP L5-S1 left with compression      Assessment/Plan Left L5-S1 HNP with compression failed cons Tx. ESI's , PT , with increasing left leg weakness.  Risks discussed all ?'s answered , he understands and agrees to proceed.   Miguel Bean C 06/22/2011, 7:20 AM

## 2011-06-22 NOTE — Anesthesia Postprocedure Evaluation (Signed)
Anesthesia Post Note  Patient: Miguel Bean  Procedure(s) Performed:  MICRODISCECTOMY LUMBAR LAMINECTOMY - Left L5-S1 Microdiscectomy  Anesthesia type: General  Patient location: PACU  Post pain: Pain level controlled and Adequate analgesia  Post assessment: Post-op Vital signs reviewed, Patient's Cardiovascular Status Stable, Respiratory Function Stable, Patent Airway and Pain level controlled  Last Vitals:  Filed Vitals:   06/22/11 1000  BP:   Pulse: 65  Temp:   Resp: 18    Post vital signs: Reviewed and stable  Level of consciousness: awake, alert  and oriented  Complications: No apparent anesthesia complications

## 2011-06-22 NOTE — Anesthesia Procedure Notes (Signed)
Procedure Name: Intubation Date/Time: 06/22/2011 8:03 AM Performed by: Neomia Dear, Sukhraj Esquivias K Pre-anesthesia Checklist: Timeout performed, Patient identified, Emergency Drugs available, Suction available and Patient being monitored Patient Re-evaluated:Patient Re-evaluated prior to inductionOxygen Delivery Method: Circle System Utilized Preoxygenation: Pre-oxygenation with 100% oxygen Intubation Type: IV induction Ventilation: Mask ventilation without difficulty Laryngoscope Size: Mac and 4 Grade View: Grade II Tube type: Oral Tube size: 8.0 mm Number of attempts: 1 Airway Equipment and Method: stylet Placement Confirmation: ETT inserted through vocal cords under direct vision,  positive ETCO2 and breath sounds checked- equal and bilateral Secured at: 24 cm Tube secured with: Tape Dental Injury: Teeth and Oropharynx as per pre-operative assessment

## 2011-06-22 NOTE — Preoperative (Signed)
Beta Blockers   Reason not to administer Beta Blockers:Not Applicable 

## 2011-06-23 MED ORDER — HYDROCODONE-ACETAMINOPHEN 5-325 MG PO TABS: 1.0000 | ORAL_TABLET | ORAL | Status: AC | PRN

## 2011-06-23 MED ORDER — METHOCARBAMOL 500 MG PO TABS: 500.0000 mg | ORAL_TABLET | Freq: Three times a day (TID) | ORAL | Status: AC

## 2011-06-23 NOTE — Discharge Summary (Signed)
Patient ID: Miguel Bean MRN: [id number] DOB/AGE: [birth year] 51 y.o.  Admit date: 06/22/2011 Discharge date: 06/23/2011  Admission Diagnoses:  Principal Problem:  *HNP (herniated nucleus pulposus), lumbar   Discharge Diagnoses:  Same  Past Medical History  Diagnosis Date  . PONV (postoperative nausea and vomiting)   . Back pain     Surgeries: Procedure(s): MICRODISCECTOMY LUMBAR LAMINECTOMY on 06/22/2011   Consultants:    Discharged Condition: Improved  Hospital Course: Miguel Bean is an 51 y.o. male who was admitted 06/22/2011 for operative treatment ofHNP (herniated nucleus pulposus), lumbar. Patient has severe unremitting pain that affects sleep, daily activities, and work/hobbies. After pre-op clearance the patient was taken to the operating room on 06/22/2011 and underwent  Procedure(s): MICRODISCECTOMY LUMBAR LAMINECTOMY.    Patient was given perioperative antibiotics: Anti-infectives     Start     Dose/Rate Route Frequency Ordered Stop   06/22/11 1530   ceFAZolin (ANCEF) IVPB 1 g/50 mL premix        1 g 100 mL/hr over 30 Minutes Intravenous Every 8 hours 06/22/11 1238 06/22/11 2309   06/21/11 1515   ceFAZolin (ANCEF) IVPB 2 g/50 mL premix        2 g 100 mL/hr over 30 Minutes Intravenous 60 min pre-op 06/21/11 1513 06/22/11 0725           Patient was given sequential compression devices, early ambulation, and chemoprophylaxis to prevent DVT.POD#1 alert oriented. No pain. Leg Pain is gone, no pain meds today. Voiding without difficulty.  Patient benefited maximally from hospital stay and there were no complications.    Recent vital signs: Patient Vitals for the past 24 hrs:  BP Temp Temp src Pulse Resp SpO2 Height Weight  06/23/11 0526 103/62 mmHg 98.9 F (37.2 C) - 69  18  97 % - -  06/22/11 2015 113/61 mmHg 99.5 F (37.5 C) - 60  18  96 % - -  06/22/11 1430 122/44 mmHg 98 F (36.7 C) - 75  18  93 % - -  06/22/11 1210 113/67 mmHg 98.3 F (36.8 C) Oral 66  16   98 % 6' (1.829 m) 117.935 kg (260 lb)  06/22/11 1153 - 98.6 F (37 C) - - - - - -  06/22/11 1145 - - - 74  23  98 % - -  06/22/11 1130 - - - 56  13  99 % - -  06/22/11 1125 124/69 mmHg - - - - - - -     Recent laboratory studies: No results found for this basename: WBC:2,HGB:2,HCT:2,PLT:2,NA:2,K:2,CL:2,CO2:2,BUN:2,CREATININE:2,GLUCOSE:2,PT:2,INR:2,CALCIUM,2: in the last 72 hours   Discharge Medications:   Medication List  As of 06/23/2011 11:16 AM   TAKE these medications         HYDROcodone-acetaminophen 5-325 MG per tablet   Commonly known as: NORCO   Take 1-2 tablets by mouth every 4 (four) hours as needed for pain.      methocarbamol 500 MG tablet   Commonly known as: ROBAXIN   Take 1 tablet (500 mg total) by mouth 3 (three) times daily.            Diagnostic Studies: Dg Chest 2 View  06/15/2011  *RADIOLOGY REPORT*  Clinical Data: Preoperative assessment for lumbar spine surgery  CHEST - 2 VIEW  Comparison: 12/24/2003  Findings: Normal heart size, mediastinal contours, pulmonary vascularity. Minimal chronic peribronchial thickening. No pulmonary filtrate, pleural effusion or pneumothorax. No acute osseous findings.  IMPRESSION: Minimal chronic bronchitic changes.  Original  Report Authenticated By: Lollie Marrow, M.D.   Dg Lumbar Spine 2-3 Views  06/22/2011  *RADIOLOGY REPORT*  Clinical Data: L5 S1 microdiskectomy.  LUMBAR SPINE - 2-3 VIEW  Comparison: MRI 06/02/2011  Findings: First lateral intraoperative image demonstrates posterior surgical instrument directed at the S1-2 level.  Second lateral intraoperative image demonstrates posterior surgical instruments at the L5-S1 level.  IMPRESSION: Intraoperative localization as above.  Original Report Authenticated By: Cyndie Chime, M.D.    Disposition:   Discharge Orders    Future Orders Please Complete By Expires   Diet - low sodium heart healthy      Call MD / Call 911      Comments:   If you experience chest pain or  shortness of breath, CALL 911 and be transported to the hospital emergency room.  If you develope a fever above 101 F, pus (white drainage) or increased drainage or redness at the wound, or calf pain, call your surgeon's office.   Constipation Prevention      Comments:   Drink plenty of fluids.  Prune juice may be helpful.  You may use a stool softener, such as Colace (over the counter) 100 mg twice a day.  Use MiraLax (over the counter) for constipation as needed.   Increase activity slowly as tolerated      Weight Bearing as taught in Physical Therapy      Comments:   Use a walker or crutches as instructed.   Discharge instructions      Comments:   No lifting greater than 10 lbs. Avoid bending, stooping and twisting. Walk in house for first week them may start to get out slowly increasing distance up to one mile by 3 weeks post op. Keep incision dry for 3 days, may use tegaderm or similar water impervious dressing.    Driving restrictions      Comments:   No driving for 2 weeks   Lifting restrictions      Comments:   No lifting for 6 weeks    [place] with Dr. Ophelia Charter in 2 weeks [phone]     Signed: Vira Browns E 06/23/2011, 11:16 AM

## 2011-06-23 NOTE — Progress Notes (Signed)
Subjective: 1 Day Post-Op Procedure(s) (LRB): MICRODISCECTOMY LUMBAR LAMINECTOMY (Left)  Patient reports pain as mild.    Objective:   VITALS:  Temp:  [98 F (36.7 C)-99.5 F (37.5 C)] 98.9 F (37.2 C) (01/26 0526) Pulse Rate:  [56-75] 69  (01/26 0526) Resp:  [13-23] 18  (01/26 0526) BP: (103-124)/(44-69) 103/62 mmHg (01/26 0526) SpO2:  [93 %-99 %] 97 % (01/26 0526) Weight:  [117.935 kg (260 lb)] 117.935 kg (260 lb) (01/25 1210)  Neurologically intact ABD soft Incision: dressing C/D/I and no drainage   LABS No results found for this basename: HGB:5,WBC:2,PLT:2 in the last 72 hours No results found for this basename: NA:2,K:2,CL:2,CO2:2,BUN:2,CREATININE:2,GLUCOSE:2, in the last 72 hours No results found for this basename: LABPT:2,INR:2 in the last 72 hours   Assessment/Plan: 1 Day Post-Op Procedure(s) (LRB): MICRODISCECTOMY LUMBAR LAMINECTOMY (Left)  Discharge home with home health  Miguel Bean E 06/23/2011, 11:13 AM

## 2011-06-23 NOTE — Progress Notes (Signed)
Occupational Therapy Evaluation Patient Details Name: Miguel Bean MRN: [id number] DOB: Mar 12, 1961 Today's Date: 06/23/2011  Problem List:  Patient Active Problem List  Diagnoses  . HNP (herniated nucleus pulposus), lumbar    Past Medical History:  Past Medical History  Diagnosis Date  . PONV (postoperative nausea and vomiting)   . Back pain    Past Surgical History:  Past Surgical History  Procedure Date  . Back surgery 08  . Vasectomy     96    OT Assessment/Plan/Recommendation OT Assessment Clinical Impression Statement: Pt presents to OT s/p back surgery.  OT educated pt in regards to ADL activity with back precautions.  No further OT needed OT Recommendation/Assessment: Patient does not need any further OT services OT Recommendation Follow Up Recommendations: No OT follow up     OT Evaluation Precautions/Restrictions  Back precautions  Prior Functioning Home Living Lives With: Spouse Receives Help From: Family Type of Home: House Bathroom Shower/Tub: Tub/shower unit;Walk-in shower Bathroom Toilet: Handicapped height Bathroom Accessibility: Yes Home Adaptive Equipment: Grab bars around toilet;Grab bars in shower;Hand-held shower hose Prior Function Level of Independence: Independent with basic ADLs;Independent with homemaking with ambulation Vocation: Full time employment ADL ADL Lower Body Bathing: Simulated;Supervision/safety Where Assessed - Lower Body Bathing: Unsupported;Sitting, bed;Sit to stand from bed Lower Body Dressing: Performed Where Assessed - Lower Body Dressing: Sitting, bed;Supported;Sit to stand from bed Toilet Transfer: Simulated;Supervision/safety Toilet Transfer Method: Proofreader: Comfort height toilet Toileting - Clothing Manipulation: Simulated;Supervision/safety Where Assessed - Glass blower/designer Manipulation: Standing Tub/Shower Transfer: Performed;Supervision/safety Tub/Shower Transfer Method:  Ambulating Ambulation Related to ADLs: pt has 3n 1 and walker and tub seat if needed.  OT session focused on ADL activity with back precautions.   ADL Comments: pt will have assist of wife for ADL activity as needed Vision/Perception  Vision - History Baseline Vision: No visual deficits Cognition Cognition Arousal/Alertness: Awake/alert Overall Cognitive Status: Appears within functional limits for tasks assessed Orientation Level: Oriented X4    End of Session OT - End of Session Activity Tolerance: Patient tolerated treatment well Patient left: in bed General Behavior During Session: [facility] for tasks performed Cognition: [facility] for tasks performed   Malay Fantroy, [place] Kung 06/23/2011, 2:12 PM

## 2011-06-27 ENCOUNTER — Encounter (HOSPITAL_COMMUNITY): Payer: Self-pay | Admitting: Orthopaedic Surgery

## 2012-03-07 IMAGING — CR DG LUMBAR SPINE 2-3V
1 series · 1 of 1 positions shown · non-contrast
Comparison: MRI 06/02/2011

CLINICAL DATA: L5 S1 microdiskectomy.

LUMBAR SPINE - 2-3 VIEW

[view not recorded]
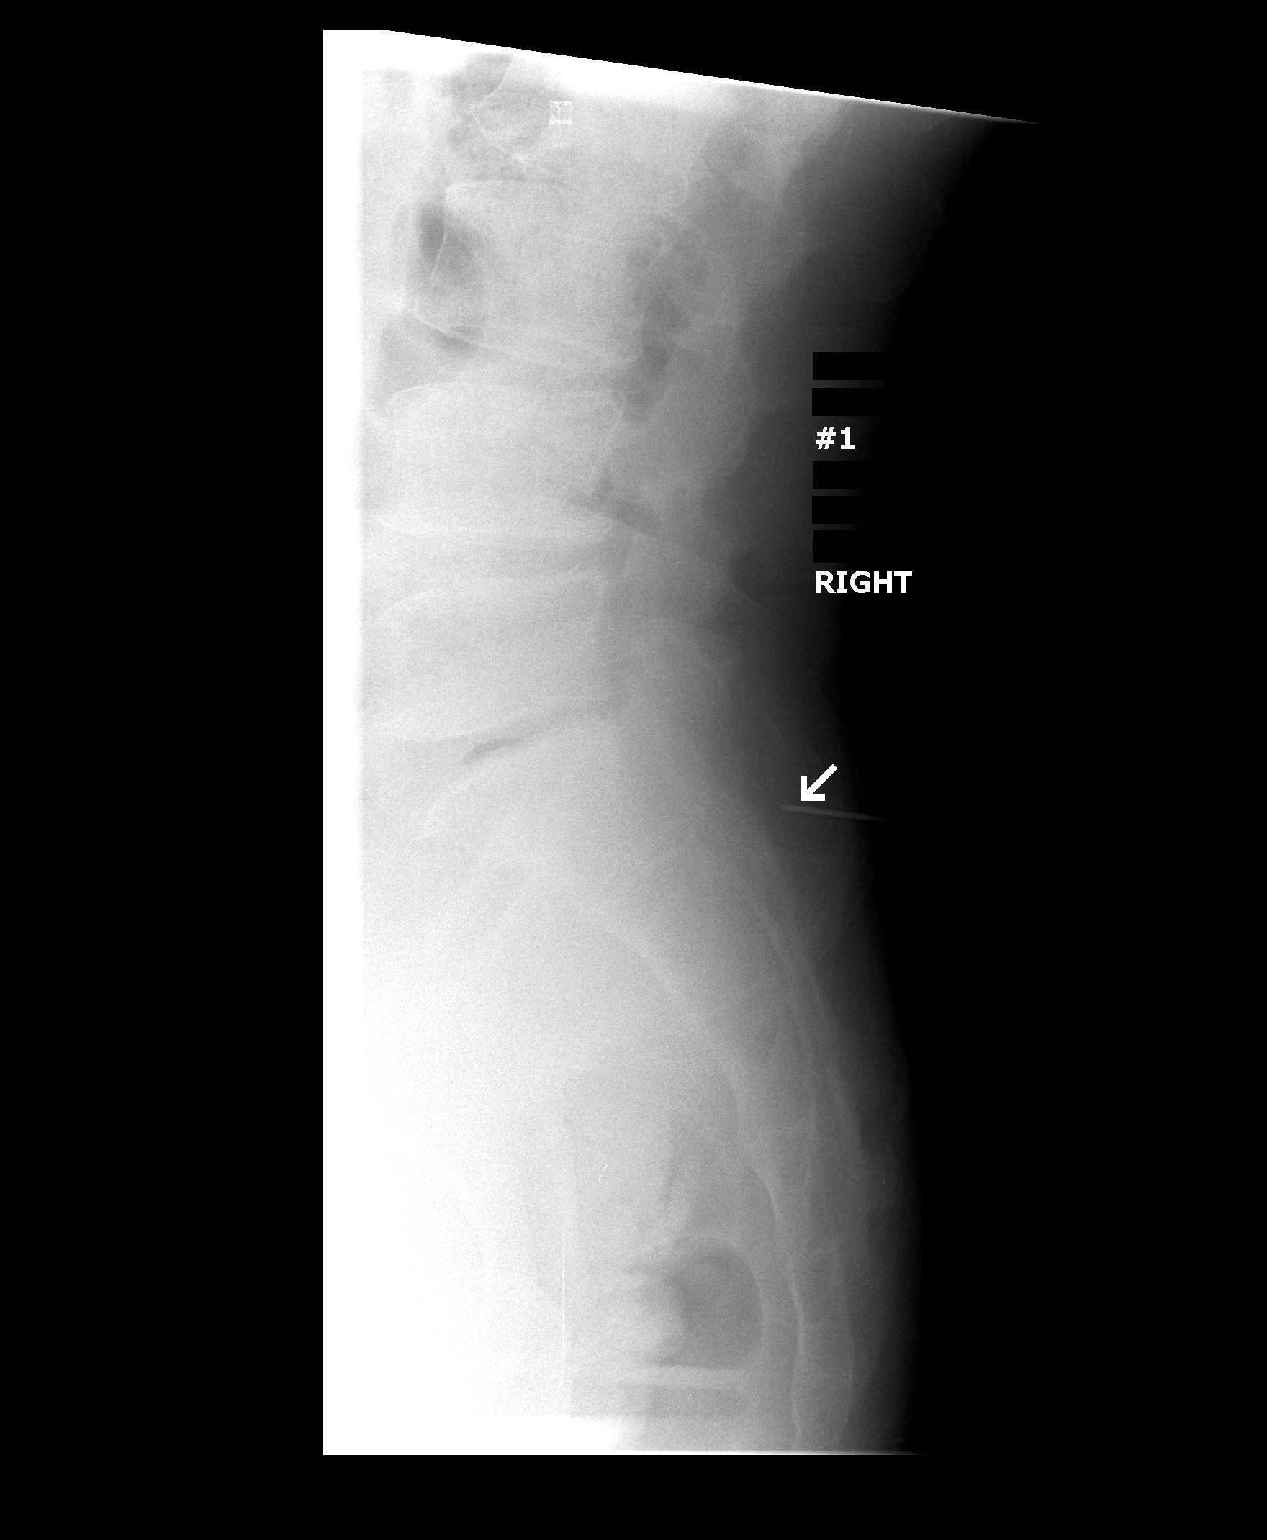

[1 of 1 positions shown; findings below may reference images not displayed]

FINDINGS: First lateral intraoperative image demonstrates posterior
surgical instrument directed at the S1-2 level.

Second lateral intraoperative image demonstrates posterior surgical
instruments at the L5-S1 level.
IMPRESSION: Intraoperative localization as above.

## 2012-06-05 ENCOUNTER — Encounter: Payer: Self-pay | Admitting: Gastroenterology

## 2013-03-11 ENCOUNTER — Ambulatory Visit (INDEPENDENT_AMBULATORY_CARE_PROVIDER_SITE_OTHER): Payer: BC Managed Care – PPO | Admitting: Physician Assistant

## 2013-03-11 VITALS — BP 124/78 | HR 64 | Temp 99.6°F | Resp 16 | Ht 71.5 in | Wt 303.6 lb

## 2013-03-11 DIAGNOSIS — B078 Other viral warts: Secondary | ICD-10-CM

## 2013-03-11 DIAGNOSIS — B079 Viral wart, unspecified: Secondary | ICD-10-CM

## 2013-03-11 NOTE — Progress Notes (Signed)
  Subjective:    Patient ID: Miguel Bean, male    DOB: 1961/01/26, 52 y.o.   MRN: [id number]  HPI   Miguel Bean is a very pleasant 52 yr old male here with concern for a skin tag at the medial edge of his left eye.  Has been there a couple months.  Not painful but bothersome as his eye lashes hit and his glasses sit there.  Has had similar lesions before.  Would like this removed today.  Otherwise feels well.     Review of Systems  Constitutional: Negative.   HENT: Negative.   Respiratory: Negative.   Cardiovascular: Negative.   Gastrointestinal: Negative.   Musculoskeletal: Negative.   Skin: Negative.   Neurological: Negative.        Objective:   Physical Exam  Vitals reviewed. Constitutional: He is oriented to person, place, and time. He appears well-developed and well-nourished. No distress.  HENT:  Head: Normocephalic and atraumatic.    Eyes: Conjunctivae are normal. No scleral icterus.  Pulmonary/Chest: Effort normal.  Neurological: He is alert and oriented to person, place, and time.  Skin: Skin is warm and dry.  Psychiatric: He has a normal mood and affect. His behavior is normal.    Procedure Note: Verbal consent obtained from the patient.  Local anesthesia with <0.25cc 1% lidocaine with epinephrine.  Betadine prep.  Lesion lifted with forceps and gently removed with iris scissors.  Hemostasis easily obtained with direct pressure.  Pt tolerated very well.        Assessment & Plan:  Filiform wart   Miguel Bean is a very pleasant 52 yr old male here with what appears to be a filiform wart at the medial edge of his left eye.  Lesion removed as above.  Pt tolerated very well.  To RTC if concerns arise.

## 2013-03-13 ENCOUNTER — Encounter: Payer: Self-pay | Admitting: Gastroenterology

## 2014-11-11 ENCOUNTER — Encounter: Payer: Self-pay | Admitting: Gastroenterology

## 2015-07-29 DIAGNOSIS — M545 Low back pain, unspecified: Secondary | ICD-10-CM | POA: Insufficient documentation

## 2015-07-29 DIAGNOSIS — M25561 Pain in right knee: Secondary | ICD-10-CM | POA: Insufficient documentation

## 2015-07-29 DIAGNOSIS — M25562 Pain in left knee: Secondary | ICD-10-CM | POA: Insufficient documentation

## 2015-07-29 DIAGNOSIS — G8929 Other chronic pain: Secondary | ICD-10-CM | POA: Insufficient documentation

## 2017-07-03 ENCOUNTER — Encounter (INDEPENDENT_AMBULATORY_CARE_PROVIDER_SITE_OTHER): Payer: Self-pay | Admitting: Physical Medicine and Rehabilitation

## 2017-07-03 ENCOUNTER — Ambulatory Visit (INDEPENDENT_AMBULATORY_CARE_PROVIDER_SITE_OTHER): Payer: PRIVATE HEALTH INSURANCE

## 2017-07-03 ENCOUNTER — Ambulatory Visit (INDEPENDENT_AMBULATORY_CARE_PROVIDER_SITE_OTHER): Payer: PRIVATE HEALTH INSURANCE | Admitting: Physical Medicine and Rehabilitation

## 2017-07-03 VITALS — BP 128/73 | HR 72 | Temp 97.9°F

## 2017-07-03 DIAGNOSIS — M5416 Radiculopathy, lumbar region: Secondary | ICD-10-CM | POA: Diagnosis not present

## 2017-07-03 DIAGNOSIS — M961 Postlaminectomy syndrome, not elsewhere classified: Secondary | ICD-10-CM | POA: Diagnosis not present

## 2017-07-03 DIAGNOSIS — M5116 Intervertebral disc disorders with radiculopathy, lumbar region: Secondary | ICD-10-CM | POA: Diagnosis not present

## 2017-07-03 NOTE — Progress Notes (Deleted)
Pt states a dull aching pain in right leg. Pt states pain has been getting worse over the past month. Pt states sitting makes the pain worse, and standing makes the pain go away.

## 2017-07-03 NOTE — Progress Notes (Signed)
Miguel Bean - 57 y.o. male MRN [id number]  Date of birth: August 22, 1960  Office Visit Note: Visit Date: 07/03/2017 PCP: Miguel Rima, MD Referred by: Miguel Limbo, PA-C  Subjective: Chief Complaint  Patient presents with  . Right Leg - Pain   HPI: Miguel Bean is a 57 year old gentleman who is followed quite some time by Miguel Bean in our office.  The last time Miguel Bean saw him was in 2017 right after I saw him for epidural injection which was completed on the right at L4 and L5.  It was a transforaminal injection that gave him quite a bit of relief.  This was right before his daughter got married.  He says ironically his other daughter is getting married soon.  He reports a dull aching right leg pain which is really in a pretty classic L5 distribution.  He states it is been really getting severely worse over the last several months and significantly worse over the last month.  He says sitting makes the pain much worse.  Standing and walking actually makes it better.  He denies any real specific back pain.  He denies any groin pain.  He has had no specific trauma.  He continues to use intermittent anti-inflammatory medicine, meloxicam.  He reports no fevers chills or night sweats or difficulty with bowel or bladder function.  Has not noted any focal weakness.  He has not had any recent images of the lumbar spine.  Last MRI was in 2013.  He has had a history of prior lumbar discectomy x2 by Miguel Bean.  He does have a known lateral disc herniation at L5-S1 on the right.  We did update lumbar spine x-ray, AP and lateral and this is reviewed below.    Review of Systems  Constitutional: Negative for chills, fever, malaise/fatigue and weight loss.  HENT: Negative for hearing loss and sinus pain.   Eyes: Negative for blurred vision, double vision and photophobia.  Respiratory: Negative for cough and shortness of breath.   Cardiovascular: Negative for chest pain, palpitations and leg swelling.    Gastrointestinal: Negative for abdominal pain, nausea and vomiting.  Genitourinary: Negative for flank pain.  Musculoskeletal: Negative for myalgias.       Right posterior lateral hip and leg pain  Skin: Negative for itching and rash.  Neurological: Positive for tingling. Negative for tremors, focal weakness and weakness.  Endo/Heme/Allergies: Negative.   Psychiatric/Behavioral: Negative for depression.  All other systems reviewed and are negative.  Otherwise per HPI.  Assessment & Plan: Visit Diagnoses:  1. Lumbar radiculopathy   2. Radiculopathy due to lumbar intervertebral disc disorder   3. Post laminectomy syndrome     Plan: Findings:  History and exam consistent with a right L5 radiculitis radiculopathy in all likelihood related to the prior disc herniation which is more of a lateral disc herniation which is still evident on prior MRI.  He has had 2 prior discectomies release intralaminar approach.  He had good relief after last injection up until just the last few months.  Nothing really revealing on x-ray imaging except by foraminal stenosis with some disc height loss.  This is particular at L5-S1.  I do not think there is any need in updating the MRI at this point although if he did not get any relief we would look at updating the MRI at some point and have him follow-up with Miguel Bean.  I do think repeating the right L5 transforaminal injection diagnostically and hopefully therapeutically  would be wise.  He has a trip upcoming in a couple of weeks his daughter is getting married.  He will continue with anti-inflammatory medication.  We talked about core strengthening and neural flossing.    Meds & Orders: No orders of the defined types were placed in this encounter.   Orders Placed This Encounter  Procedures  . XR Lumbar Spine 2-3 Views    Follow-up: Return for Right L5 transforaminal epidural steroid injection..   Procedures: No procedures performed  No notes on file    Clinical History: MRI LUMBAR SPINE WITHOUT CONTRAST 06/02/2011   Technique: Multiplanar and multiecho pulse sequences of the lumbar spine were obtained without intravenous contrast.   Comparison: Prior studies 12/09/2009.   Findings: The sagittal MR images demonstrate normal alignment of the lumbar vertebral bodies. Stable changes of degenerative lumbar spondylosis with disc disease and facet disease. The vertebral bodies demonstrate normal marrow signal except for a few scattered hemangiomas. There are moderate facet degenerative changes but no definite pars defects. No significant paraspinal or retroperitoneal process.   L1-2: Stable shallow broad-based left paracentral and medial foraminal disc protrusion with lateral recess encroachment on the left L2 nerve root and possible mild irritation of the exiting L1 nerve root.   L2-3: Diffuse bulging annulus and facet disease with mild lateral recess encroachment bilaterally. There is mild left foraminal stenosis but this has improved slightly when compared the prior examination.   L3-4: Right foraminal and extraforaminal disc protrusion contacts the right L3 nerve root. This appears stable. No significant spinal stenosis. Mild lateral recess encroachment bilaterally and mild left foraminal encroachment.   L4-5: Diffuse bulging annulus and moderate facet disease contribute to mild spinal and bilateral lateral recess stenosis. Stable surgical change with a left-sided laminectomy. Foraminal stenosis bilaterally is stable.   L5-S1: Diffuse bulging annulus and osteophytic ridging with mild impression on the ventral thecal sac. There is a stable large left foraminal/extraforaminal disc protrusion on the left likely affecting the left L5 nerve root.   IMPRESSION:   Significant multilevel disc disease with specific findings as detailed above. No significant change when compared with the prior study.  He reports that  has never  smoked. he has never used smokeless tobacco. No results for input(s): HGBA1C, LABURIC in the last 8760 hours.  Objective:  VS:  HT:    WT:   BMI:     BP:128/73  HR:72bpm  TEMP:97.9 F (36.6 C)(Oral)  RESP:96 % Physical Exam  Constitutional: He is oriented to person, place, and time. He appears well-developed and well-nourished. No distress.  Obese  HENT:  Head: Normocephalic and atraumatic.  Eyes: Conjunctivae are normal. Pupils are equal, round, and reactive to light.  Neck: Normal range of motion. Neck supple.  Cardiovascular: Regular rhythm and intact distal pulses.  Pulmonary/Chest: Effort normal. No respiratory distress.  Musculoskeletal:  Patient is a little bit slow to rise from a seated position and does have some mild pain with extension of the lumbar spine.  He has no pain with hip rotation.  He does have an equivocally positive slump test on the right.  He has good distal strength without clonus.  No pain over the greater trochanters.  Neurological: He is alert and oriented to person, place, and time. He exhibits normal muscle tone. Coordination normal.  Skin: Skin is warm and dry. No rash noted. No erythema.  Psychiatric: He has a normal mood and affect.  Nursing note and vitals reviewed.   Ortho Exam Imaging: Xr  Lumbar Spine 2-3 Views  Result Date: 07/04/2017 AP and lateral of the lumbar spine shows mild to moderate degenerative changes of the right hip compared to left.  He has some pelvic rotation.  Sacroiliac joints are well maintained without sclerosis.  He has very mild rightward scoliosis centered at about the L2 vertebral body.  He has facet arthropathy of the lower L4-5 and L5-S1 facet joints.  He has by foraminal narrowing at L5-S1.  He does have some endplate spurring which is more mid to upper lumbar region around L3.  There are no fractures or dislocations.   Past Medical/Family/Surgical/Social History: Medications & Allergies reviewed per EMR Patient Active  Problem List   Diagnosis Date Noted  . HNP (herniated nucleus pulposus), lumbar 06/22/2011    Class: Diagnosis of   Past Medical History:  Diagnosis Date  . Back pain   . PONV (postoperative nausea and vomiting)    History reviewed. No pertinent family history. Past Surgical History:  Procedure Laterality Date  . BACK SURGERY  08  . LUMBAR LAMINECTOMY  06/22/2011   Procedure: MICRODISCECTOMY LUMBAR LAMINECTOMY;  Surgeon: Marybelle Killings, MD;  Location: [facility];  Service: Orthopedics;  Laterality: Left;  Left L5-S1 Microdiscectomy  . VASECTOMY     96   Social History   Occupational History  . Not on file  Tobacco Use  . Smoking status: Never Smoker  . Smokeless tobacco: Never Used  Substance and Sexual Activity  . Alcohol use: No  . Drug use: No  . Sexual activity: Yes

## 2017-07-04 ENCOUNTER — Encounter (INDEPENDENT_AMBULATORY_CARE_PROVIDER_SITE_OTHER): Payer: Self-pay | Admitting: Physical Medicine and Rehabilitation

## 2017-07-15 ENCOUNTER — Ambulatory Visit (INDEPENDENT_AMBULATORY_CARE_PROVIDER_SITE_OTHER): Payer: PRIVATE HEALTH INSURANCE

## 2017-07-15 ENCOUNTER — Ambulatory Visit (INDEPENDENT_AMBULATORY_CARE_PROVIDER_SITE_OTHER): Payer: PRIVATE HEALTH INSURANCE | Admitting: Physical Medicine and Rehabilitation

## 2017-07-15 ENCOUNTER — Encounter (INDEPENDENT_AMBULATORY_CARE_PROVIDER_SITE_OTHER): Payer: Self-pay | Admitting: Physical Medicine and Rehabilitation

## 2017-07-15 VITALS — BP 147/78 | HR 71 | Temp 98.6°F

## 2017-07-15 DIAGNOSIS — M5116 Intervertebral disc disorders with radiculopathy, lumbar region: Secondary | ICD-10-CM | POA: Diagnosis not present

## 2017-07-15 DIAGNOSIS — M5416 Radiculopathy, lumbar region: Secondary | ICD-10-CM

## 2017-07-15 MED ORDER — BETAMETHASONE SOD PHOS & ACET 6 (3-3) MG/ML IJ SUSP
12.0000 mg | Freq: Once | INTRAMUSCULAR | Status: AC
  Administered 2017-07-15: 12 mg

## 2017-07-15 NOTE — Patient Instructions (Signed)

## 2017-07-15 NOTE — Progress Notes (Deleted)
Pt states a dull aching pain that goes down the right leg, with pressure on right ankle. Pt states pain started a month ago that has gotten worse. Pt states after sitting pain immediately starts. Pt states standing and walking on a leveled surface makes pain better. +Driver, -BT, -Dye Allergies.

## 2017-07-16 NOTE — Procedures (Signed)
Lumbosacral Transforaminal Epidural Steroid Injection - Sub-Pedicular Approach with Fluoroscopic Guidance  Patient: Miguel Bean      Date of Birth: [birth year] MRN: [id number] PCP: Helane Rima, MD      Visit Date: 07/15/2017   Universal Protocol:    Date/Time: 07/15/2017  Consent Given By: the patient  Position: PRONE  Additional Comments: Vital signs were monitored before and after the procedure. Patient was prepped and draped in the usual sterile fashion. The correct patient, procedure, and site was verified.   Injection Procedure Details:  Procedure Site One Meds Administered:  Meds ordered this encounter  Medications  . betamethasone acetate-betamethasone sodium phosphate (CELESTONE) injection 12 mg    Laterality: Right  Location/Site:  L5-S1  Needle size: 22 G  Needle type: Spinal  Needle Placement: Transforaminal  Findings:    -Comments: Excellent flow of contrast along the nerve and into the epidural space.  Procedure Details: After squaring off the end-plates to get a true AP view, the C-arm was positioned so that an oblique view of the foramen as noted above was visualized. The target area is just inferior to the "nose of the scotty dog" or sub pedicular. The soft tissues overlying this structure were infiltrated with 2-3 ml. of 1% Lidocaine without Epinephrine.  The spinal needle was inserted toward the target using a "trajectory" view along the fluoroscope beam.  Under AP and lateral visualization, the needle was advanced so it did not puncture dura and was located close the 6 O'Clock position of the pedical in AP tracterory. Biplanar projections were used to confirm position. Aspiration was confirmed to be negative for CSF and/or blood. A 1-2 ml. volume of Isovue-250 was injected and flow of contrast was noted at each level. Radiographs were obtained for documentation purposes.   After attaining the desired flow of contrast documented above, a 0.5 to 1.0  ml test dose of 0.25% Marcaine was injected into each respective transforaminal space.  The patient was observed for 90 seconds post injection.  After no sensory deficits were reported, and normal lower extremity motor function was noted,   the above injectate was administered so that equal amounts of the injectate were placed at each foramen (level) into the transforaminal epidural space.   Additional Comments:  The patient tolerated the procedure well Dressing: Band-Aid    Post-procedure details: Patient was observed during the procedure. Post-procedure instructions were reviewed.  Patient left the clinic in stable condition.

## 2017-07-16 NOTE — Progress Notes (Signed)
Miguel Bean Staff - 57 y.o. male MRN [id number]  Date of birth: May 27, 1961  Office Visit Note: Visit Date: 07/15/2017 PCP: Miguel Rima, MD Referred by: Miguel Rima, MD  Subjective: Chief Complaint  Patient presents with  . Right Leg - Pain  . Right Ankle - Other   HPI: Miguel Bean comes in today for planned right L5 transforaminal epidural steroid injection.  Please see our prior evaluation and management note for further details and justification.    ROS Otherwise per HPI.  Assessment & Plan: Visit Diagnoses:  1. Lumbar radiculopathy   2. Radiculopathy due to lumbar intervertebral disc disorder     Plan: No additional findings.   Meds & Orders:  Meds ordered this encounter  Medications  . betamethasone acetate-betamethasone sodium phosphate (CELESTONE) injection 12 mg    Orders Placed This Encounter  Procedures  . XR C-ARM NO REPORT  . Epidural Steroid injection    Follow-up: Return if symptoms worsen or fail to improve.   Procedures: No procedures performed  Lumbosacral Transforaminal Epidural Steroid Injection - Sub-Pedicular Approach with Fluoroscopic Guidance  Patient: Miguel Bean      Date of Birth: 27-Apr-1961 MRN: [id number] PCP: Miguel Rima, MD      Visit Date: 07/15/2017   Universal Protocol:    Date/Time: 07/15/2017  Consent Given By: the patient  Position: PRONE  Additional Comments: Vital signs were monitored before and after the procedure. Patient was prepped and draped in the usual sterile fashion. The correct patient, procedure, and site was verified.   Injection Procedure Details:  Procedure Site One Meds Administered:  Meds ordered this encounter  Medications  . betamethasone acetate-betamethasone sodium phosphate (CELESTONE) injection 12 mg    Laterality: Right  Location/Site:  L5-S1  Needle size: 22 G  Needle type: Spinal  Needle Placement: Transforaminal  Findings:    -Comments: Excellent flow of contrast along  the nerve and into the epidural space.  Procedure Details: After squaring off the end-plates to get a true AP view, the C-arm was positioned so that an oblique view of the foramen as noted above was visualized. The target area is just inferior to the "nose of the scotty dog" or sub pedicular. The soft tissues overlying this structure were infiltrated with 2-3 ml. of 1% Lidocaine without Epinephrine.  The spinal needle was inserted toward the target using a "trajectory" view along the fluoroscope beam.  Under AP and lateral visualization, the needle was advanced so it did not puncture dura and was located close the 6 O'Clock position of the pedical in AP tracterory. Biplanar projections were used to confirm position. Aspiration was confirmed to be negative for CSF and/or blood. A 1-2 ml. volume of Isovue-250 was injected and flow of contrast was noted at each level. Radiographs were obtained for documentation purposes.   After attaining the desired flow of contrast documented above, a 0.5 to 1.0 ml test dose of 0.25% Marcaine was injected into each respective transforaminal space.  The patient was observed for 90 seconds post injection.  After no sensory deficits were reported, and normal lower extremity motor function was noted,   the above injectate was administered so that equal amounts of the injectate were placed at each foramen (level) into the transforaminal epidural space.   Additional Comments:  The patient tolerated the procedure well Dressing: Band-Aid    Post-procedure details: Patient was observed during the procedure. Post-procedure instructions were reviewed.  Patient left the clinic in stable condition.  Clinical History: MRI LUMBAR SPINE WITHOUT CONTRAST 06/02/2011   Technique: Multiplanar and multiecho pulse sequences of the lumbar spine were obtained without intravenous contrast.   Comparison: Prior studies 12/09/2009.   Findings: The sagittal MR images demonstrate  normal alignment of the lumbar vertebral bodies. Stable changes of degenerative lumbar spondylosis with disc disease and facet disease. The vertebral bodies demonstrate normal marrow signal except for a few scattered hemangiomas. There are moderate facet degenerative changes but no definite pars defects. No significant paraspinal or retroperitoneal process.   L1-2: Stable shallow broad-based left paracentral and medial foraminal disc protrusion with lateral recess encroachment on the left L2 nerve root and possible mild irritation of the exiting L1 nerve root.   L2-3: Diffuse bulging annulus and facet disease with mild lateral recess encroachment bilaterally. There is mild left foraminal stenosis but this has improved slightly when compared the prior examination.   L3-4: Right foraminal and extraforaminal disc protrusion contacts the right L3 nerve root. This appears stable. No significant spinal stenosis. Mild lateral recess encroachment bilaterally and mild left foraminal encroachment.   L4-5: Diffuse bulging annulus and moderate facet disease contribute to mild spinal and bilateral lateral recess stenosis. Stable surgical change with a left-sided laminectomy. Foraminal stenosis bilaterally is stable.   L5-S1: Diffuse bulging annulus and osteophytic ridging with mild impression on the ventral thecal sac. There is a stable large left foraminal/extraforaminal disc protrusion on the left likely affecting the left L5 nerve root.   IMPRESSION:   Significant multilevel disc disease with specific findings as detailed above. No significant change when compared with the prior study.  He reports that  has never smoked. he has never used smokeless tobacco. No results for input(s): HGBA1C, LABURIC in the last 8760 hours.  Objective:  VS:  HT:    WT:   BMI:     BP:(!) 147/78  HR:71bpm  TEMP:98.6 F (37 C)(Oral)  RESP:97 % Physical Exam  Musculoskeletal:  The patient ambulates  without aid with a forward flexed spine with good distal strength.    Ortho Exam Imaging: Xr C-arm No Report  Result Date: 07/15/2017 Please see Notes or Procedures tab for imaging impression.   Past Medical/Family/Surgical/Social History: Medications & Allergies reviewed per EMR Patient Active Problem List   Diagnosis Date Noted  . HNP (herniated nucleus pulposus), lumbar 06/22/2011    Class: Diagnosis of   Past Medical History:  Diagnosis Date  . Back pain   . PONV (postoperative nausea and vomiting)    History reviewed. No pertinent family history. Past Surgical History:  Procedure Laterality Date  . BACK SURGERY  08  . LUMBAR LAMINECTOMY  06/22/2011   Procedure: MICRODISCECTOMY LUMBAR LAMINECTOMY;  Surgeon: Marybelle Killings, MD;  Location: [place];  Service: Orthopedics;  Laterality: Left;  Left L5-S1 Microdiscectomy  . VASECTOMY     96   Social History   Occupational History  . Not on file  Tobacco Use  . Smoking status: Never Smoker  . Smokeless tobacco: Never Used  Substance and Sexual Activity  . Alcohol use: No  . Drug use: No  . Sexual activity: Yes

## 2018-02-22 ENCOUNTER — Encounter: Payer: Self-pay | Admitting: Behavioral Health

## 2018-02-22 DIAGNOSIS — F329 Major depressive disorder, single episode, unspecified: Secondary | ICD-10-CM | POA: Insufficient documentation

## 2018-02-22 DIAGNOSIS — F32A Depression, unspecified: Secondary | ICD-10-CM | POA: Insufficient documentation

## 2018-02-22 DIAGNOSIS — F419 Anxiety disorder, unspecified: Secondary | ICD-10-CM | POA: Insufficient documentation

## 2018-03-11 ENCOUNTER — Ambulatory Visit: Payer: BLUE CROSS/BLUE SHIELD | Admitting: Psychiatry

## 2018-03-11 DIAGNOSIS — F3289 Other specified depressive episodes: Secondary | ICD-10-CM

## 2018-03-11 MED ORDER — TRAZODONE HCL 50 MG PO TABS: 50.0000 mg | ORAL_TABLET | Freq: Every day | ORAL | 2 refills | Status: DC

## 2018-03-11 MED ORDER — SERTRALINE HCL 100 MG PO TABS: 100.0000 mg | ORAL_TABLET | Freq: Every day | ORAL | 2 refills | Status: DC

## 2018-03-11 NOTE — Progress Notes (Signed)
[facility]  Patient ID: Miguel Bean,  MRN: [id number]  PCP: Helane Rima, MD  Date of Evaluation: 03/11/2018 Time spent:20 minutes   HISTORY/CURRENT STATUS: HPI patient is a 57 year old white male.  He is being treated for depression.  His last visit 12/17/2017 he continued to do well.  We continue his medications as are. Patient continues to do well.  He is having some insomnia due to leg pain.  He wants to postpone doing his snap sleep test until after he gets knees  taken care of.  Individual Medical History/ Review of Systems: Changes? no  Allergies: Patient has no known allergies.  Current Medications:  Current Outpatient Medications:  .  meloxicam (MOBIC) 15 MG tablet, Take 15 mg by mouth daily., Disp: , Rfl:  .  sertraline (ZOLOFT) 100 MG tablet, Take by mouth., Disp: , Rfl:  .  traZODone (DESYREL) 50 MG tablet, Take by mouth., Disp: , Rfl:  Medication Side Effects: None  Family Medical/ Social History: Changes? No  MENTAL HEALTH EXAM:  There were no vitals taken for this visit.There is no height or weight on file to calculate BMI.  General Appearance: Casual  Eye Contact:  Good  Speech:  Normal Rate  Volume:  Normal  Mood:  Euthymic  Affect:  Appropriate  Thought Process:  Linear  Orientation:  Full (Time, Place, and Person)  Thought Content: Logical   Suicidal Thoughts:  No  Homicidal Thoughts:  No  Memory:  normal  Judgement:  good  Insight:  Good  Psychomotor Activity:  Normal  Concentration:  Concentration: Good  Recall:  Good  Fund of Knowledge: Good  Language: Good  Akathisia:  NA  AIMS (if indicated): na  Assets:  Desire for Improvement  ADL's:  Intact  Cognition: WNL  Prognosis:  Good    DIAGNOSES: No diagnosis found.  RECOMMENDATIONS: no change in treatment. Wants to consider snap sleep test after his knee is better    [employer], PA-C

## 2018-06-04 ENCOUNTER — Other Ambulatory Visit: Payer: Self-pay

## 2018-06-04 MED ORDER — SERTRALINE HCL 100 MG PO TABS: 100.0000 mg | ORAL_TABLET | Freq: Every day | ORAL | 0 refills | Status: DC

## 2018-06-09 ENCOUNTER — Telehealth: Payer: Self-pay

## 2018-06-09 MED ORDER — TRAZODONE HCL 50 MG PO TABS: 50.0000 mg | ORAL_TABLET | Freq: Every day | ORAL | 0 refills | Status: DC

## 2018-06-09 NOTE — Telephone Encounter (Signed)
Pharmacy requesting 90 day rx for trazodone 50 mg 1 at hs. Request sent to [facility].

## 2018-06-12 ENCOUNTER — Ambulatory Visit: Payer: BLUE CROSS/BLUE SHIELD | Admitting: Psychiatry

## 2018-06-12 DIAGNOSIS — F3289 Other specified depressive episodes: Secondary | ICD-10-CM

## 2018-06-12 NOTE — Progress Notes (Signed)
[facility]  Patient ID: Miguel Bean,  MRN: [id number]  PCP: Helane Rima, MD  Date of Evaluation: 06/12/2018 Time spent:20 minutes  Chief Complaint:   HISTORY/CURRENT STATUS: HPI patient last seen 03/11/2018.  Doing well.  Continues to do well.  Individual Medical History/ Review of Systems: Changes? :No   Allergies: Patient has no known allergies.  Current Medications:  Current Outpatient Medications:  .  meloxicam (MOBIC) 15 MG tablet, Take 15 mg by mouth daily., Disp: , Rfl:  .  sertraline (ZOLOFT) 100 MG tablet, Take 1 tablet (100 mg total) by mouth daily., Disp: 30 tablet, Rfl: 0 .  traZODone (DESYREL) 50 MG tablet, Take 1 tablet (50 mg total) by mouth at bedtime., Disp: 90 tablet, Rfl: 0 Medication Side Effects: no  Family Medical/ Social History: Changes?no  MENTAL HEALTH EXAM:  There were no vitals taken for this visit.There is no height or weight on file to calculate BMI.  General Appearance: Casual  Eye Contact:  Good  Speech:  Normal Rate  Volume:  Normal  Mood:  Euthymic  Affect:  Appropriate  Thought Process:  Linear  Orientation:  Full (Time, Place, and Person)  Thought Content: Logical   Suicidal Thoughts:  No  Homicidal Thoughts:  No  Memory:  WNL  Judgement:  Good  Insight:  Good  Psychomotor Activity:  Normal  Concentration:  Concentration: Good  Recall:  Good  Fund of Knowledge: Good  Language: Good  Assets:  Social Support  ADL's:  Intact  Cognition: WNL  Prognosis:  Good    DIAGNOSES: No diagnosis found.  Receiving Psychotherapy: No    RECOMMENDATIONS: Patient will continue trazodone 50 mg at bedtime and Zoloft 100 mg a day.   next visit in 4 months.   Comer Locket, PA-C

## 2018-06-23 ENCOUNTER — Encounter: Payer: Self-pay | Admitting: Family Medicine

## 2018-06-23 ENCOUNTER — Ambulatory Visit (INDEPENDENT_AMBULATORY_CARE_PROVIDER_SITE_OTHER): Payer: BLUE CROSS/BLUE SHIELD | Admitting: Family Medicine

## 2018-06-23 ENCOUNTER — Ambulatory Visit (INDEPENDENT_AMBULATORY_CARE_PROVIDER_SITE_OTHER): Payer: BLUE CROSS/BLUE SHIELD

## 2018-06-23 VITALS — BP 126/74 | HR 83 | Temp 97.8°F | Ht 71.5 in | Wt 328.0 lb

## 2018-06-23 DIAGNOSIS — M1712 Unilateral primary osteoarthritis, left knee: Secondary | ICD-10-CM | POA: Diagnosis not present

## 2018-06-23 DIAGNOSIS — M25562 Pain in left knee: Secondary | ICD-10-CM | POA: Diagnosis not present

## 2018-06-23 MED ORDER — DICLOFENAC SODIUM 2 % TD SOLN: 1.0000 "application " | Freq: Two times a day (BID) | TRANSDERMAL | 3 refills | Status: DC

## 2018-06-23 NOTE — Patient Instructions (Addendum)
Nice to meet you  Please try the pennsaid  I will call you with the results today  We will call you about the gel injections.

## 2018-06-23 NOTE — Progress Notes (Signed)
Miguel Bean - 58 y.o. male MRN [id number]  Date of birth: 09/07/1960  SUBJECTIVE:  Including CC & ROS.  Chief Complaint  Patient presents with  . Knee Pain    pt here for left knee pain , pt has had knee problems for years but has gotten worse since 01/2018 he has had previous knee injections ; last knee injection 01/2018     Miguel Bean is a 58 y.o. male that is presenting with left knee pain.  The pain is acute on chronic.  Denies any inciting event.  Pain is intermittent in nature.  Has not done anything for the pain.  Feels the pain medially in the joint line.  Denies any mechanical symptoms.  Starting to feel the pain more when he tries to exercise.  Has historically been active but is limited now.  Would like to lose weight but the pain interferes with his exercise.  Pain is throbbing.  Denies any significant swelling.   Review of Systems  Constitutional: Negative for fever.  HENT: Negative for congestion.   Respiratory: Negative for cough.   Cardiovascular: Negative for chest pain.  Gastrointestinal: Negative for abdominal pain.  Musculoskeletal: Positive for arthralgias.  Skin: Negative for color change.  Neurological: Negative for weakness.  Hematological: Negative for adenopathy.  Psychiatric/Behavioral: Negative for agitation.    HISTORY: Past Medical, Surgical, Social, and Family History Reviewed & Updated per EMR.   Pertinent Historical Findings include:  Past Medical History:  Diagnosis Date  . Back pain   . PONV (postoperative nausea and vomiting)     Past Surgical History:  Procedure Laterality Date  . BACK SURGERY  08  . LUMBAR LAMINECTOMY  06/22/2011   Procedure: MICRODISCECTOMY LUMBAR LAMINECTOMY;  Surgeon: Marybelle Killings, MD;  Location: [place];  Service: Orthopedics;  Laterality: Left;  Left L5-S1 Microdiscectomy  . VASECTOMY     96    No Known Allergies  No family history on file.   Social History   Socioeconomic History  . Marital status: Married    Spouse name: Not on file  . Number of children: Not on file  . Years of education: Not on file  . Highest education level: Not on file  Occupational History  . Not on file  Social Needs  . Financial resource strain: Not on file  . Food insecurity:    Worry: Not on file    Inability: Not on file  . Transportation needs:    Medical: Not on file    Non-medical: Not on file  Tobacco Use  . Smoking status: Never Smoker  . Smokeless tobacco: Never Used  Substance and Sexual Activity  . Alcohol use: No  . Drug use: No  . Sexual activity: Yes  Lifestyle  . Physical activity:    Days per week: Not on file    Minutes per session: Not on file  . Stress: Not on file  Relationships  . Social connections:    Talks on phone: Not on file    Gets together: Not on file    Attends religious service: Not on file    Active member of club or organization: Not on file    Attends meetings of clubs or organizations: Not on file    Relationship status: Not on file  . Intimate partner violence:    Fear of current or ex partner: Not on file    Emotionally abused: Not on file    Physically abused: Not on file  Forced sexual activity: Not on file  Other Topics Concern  . Not on file  Social History Narrative  . Not on file     PHYSICAL EXAM:  VS: BP 126/74   Pulse 83   Temp 97.8 F (36.6 C) (Oral)   Ht 5' 11.5" (1.816 m)   Wt (!) 328 lb (148.8 kg)   SpO2 96%   BMI 45.11 kg/m  Physical Exam Gen: NAD, alert, cooperative with exam, well-appearing ENT: normal lips, normal nasal mucosa,  Eye: normal EOM, normal conjunctiva and lids CV:  no edema, +2 pedal pulses   Resp: no accessory muscle use, non-labored,  Skin: no rashes, no areas of induration  Neuro: normal tone, normal sensation to touch Psych:  normal insight, alert and oriented MSK:  Left knee: No obvious effusion. Normal range of motion. Normal strength resistance.   Instability with valgus and varus stress  testing. Negative McMurray's test. No pain with patellar grind or compression. Neurovascular intact  Limited ultrasound: Left knee:  Trace effusion. Normal-appearing quadricep and patellar tendon. Normal-appearing lateral joint line. Significant degenerative changes of the medial joint line  Summary: Moderate to severe degenerative changes of the medial joint line.  Ultrasound and interpretation by Clearance Coots, MD      ASSESSMENT & PLAN:   Primary osteoarthritis of left knee Pain likely result of degenerative changes.  No mechanical symptoms today. -X-ray today. -Pennsaid. -Counseled on home exercise therapy and supportive care. -Can try gel injections with most recent injection only lasting 2 days.

## 2018-06-24 ENCOUNTER — Telehealth: Payer: Self-pay | Admitting: Family Medicine

## 2018-06-24 NOTE — Assessment & Plan Note (Signed)
Pain likely result of degenerative changes.  No mechanical symptoms today. -X-ray today. -Pennsaid. -Counseled on home exercise therapy and supportive care. -Can try gel injections with most recent injection only lasting 2 days.

## 2018-06-24 NOTE — Telephone Encounter (Signed)
Spoke with patient about results.   Rosemarie Ax, MD [facility] 06/24/2018, 12:12 PM

## 2018-07-04 ENCOUNTER — Other Ambulatory Visit: Payer: Self-pay

## 2018-07-04 MED ORDER — SERTRALINE HCL 100 MG PO TABS: 100.0000 mg | ORAL_TABLET | Freq: Every day | ORAL | 1 refills | Status: DC

## 2018-07-07 ENCOUNTER — Ambulatory Visit: Payer: BLUE CROSS/BLUE SHIELD | Admitting: Family Medicine

## 2018-07-10 ENCOUNTER — Ambulatory Visit (INDEPENDENT_AMBULATORY_CARE_PROVIDER_SITE_OTHER): Payer: BLUE CROSS/BLUE SHIELD

## 2018-07-10 ENCOUNTER — Ambulatory Visit: Payer: BLUE CROSS/BLUE SHIELD | Admitting: Family Medicine

## 2018-07-10 DIAGNOSIS — M1712 Unilateral primary osteoarthritis, left knee: Secondary | ICD-10-CM

## 2018-07-10 NOTE — Assessment & Plan Note (Addendum)
Presenting for first hymovis injection. Patient supplied hymovis.  - will follow up to complete the series  - counseled on supportive care

## 2018-07-10 NOTE — Progress Notes (Addendum)
Miguel Bean - 58 y.o. male MRN [id number]  Date of birth: Feb 20, 1961  SUBJECTIVE:  Including CC & ROS.  No chief complaint on file.   Miguel Bean is a 58 y.o. male that is  Presenting with left knee pain. He is here to have his first injection of hymovis.    Review of Systems  HISTORY: Past Medical, Surgical, Social, and Family History Reviewed & Updated per EMR.   Pertinent Historical Findings include:  Past Medical History:  Diagnosis Date  . Back pain   . PONV (postoperative nausea and vomiting)     Past Surgical History:  Procedure Laterality Date  . BACK SURGERY  08  . LUMBAR LAMINECTOMY  06/22/2011   Procedure: MICRODISCECTOMY LUMBAR LAMINECTOMY;  Surgeon: Marybelle Killings, MD;  Location: [place];  Service: Orthopedics;  Laterality: Left;  Left L5-S1 Microdiscectomy  . VASECTOMY     96    No Known Allergies  No family history on file.   Social History   Socioeconomic History  . Marital status: Married    Spouse name: Not on file  . Number of children: Not on file  . Years of education: Not on file  . Highest education level: Not on file  Occupational History  . Not on file  Social Needs  . Financial resource strain: Not on file  . Food insecurity:    Worry: Not on file    Inability: Not on file  . Transportation needs:    Medical: Not on file    Non-medical: Not on file  Tobacco Use  . Smoking status: Never Smoker  . Smokeless tobacco: Never Used  Substance and Sexual Activity  . Alcohol use: No  . Drug use: No  . Sexual activity: Yes  Lifestyle  . Physical activity:    Days per week: Not on file    Minutes per session: Not on file  . Stress: Not on file  Relationships  . Social connections:    Talks on phone: Not on file    Gets together: Not on file    Attends religious service: Not on file    Active member of club or organization: Not on file    Attends meetings of clubs or organizations: Not on file    Relationship status: Not on file  .  Intimate partner violence:    Fear of current or ex partner: Not on file    Emotionally abused: Not on file    Physically abused: Not on file    Forced sexual activity: Not on file  Other Topics Concern  . Not on file  Social History Narrative  . Not on file     PHYSICAL EXAM:  VS: There were no vitals taken for this visit. Physical Exam Gen: NAD, alert, cooperative with exam, well-appearing  Aspiration/Injection Procedure Note Miguel Bean 04/15/1961  Procedure: Injection Indications: left knee pain   Procedure Details Consent: Risks of procedure as well as the alternatives and risks of each were explained to the (patient/caregiver).  Consent for procedure obtained. Time Out: Verified patient identification, verified procedure, site/side was marked, verified correct patient position, special equipment/implants available, medications/allergies/relevent history reviewed, required imaging and test results available.  Performed.  The area was cleaned with iodine and alcohol swabs.    The left knee superior lateral suprapatellar pouch was injected using 4 cc's of 1% lidocaine with a 22 1 1/2" needle.  The syringe was then switched out and an injectate of 24  mg/3 mL hymovis was injected. Ultrasound was used. Images were obtained in Long views showing the injection.    A sterile dressing was applied.  Patient did tolerate procedure well.          ASSESSMENT & PLAN:   Primary osteoarthritis of left knee Presenting for first hymovis injection. Patient supplied hymovis.  - will follow up to complete the series  - counseled on supportive care

## 2018-07-10 NOTE — Patient Instructions (Signed)
Good to see you  Take tylenol 650 mg three times a day is the best evidence based medicine we have for arthritis.  Glucosamine sulfate 750mg  twice a day is a supplement that has been shown to help moderate to severe arthritis. Vitamin D 2000 IU daily Fish oil 2 grams daily.  Tumeric 500mg  twice daily.  Capsaicin topically up to four times a day may also help with pain. Please see me again in 1 week

## 2018-09-05 ENCOUNTER — Other Ambulatory Visit: Payer: Self-pay

## 2018-09-05 MED ORDER — TRAZODONE HCL 50 MG PO TABS: 50.0000 mg | ORAL_TABLET | Freq: Every day | ORAL | 0 refills | Status: DC

## 2018-10-13 ENCOUNTER — Ambulatory Visit: Payer: BLUE CROSS/BLUE SHIELD | Admitting: Psychiatry

## 2018-11-10 ENCOUNTER — Ambulatory Visit (INDEPENDENT_AMBULATORY_CARE_PROVIDER_SITE_OTHER): Payer: BC Managed Care – PPO

## 2018-11-10 ENCOUNTER — Ambulatory Visit: Payer: BC Managed Care – PPO | Admitting: Family Medicine

## 2018-11-10 DIAGNOSIS — M1712 Unilateral primary osteoarthritis, left knee: Secondary | ICD-10-CM

## 2018-11-10 NOTE — Progress Notes (Signed)
Miguel Bean - 58 y.o. male MRN [id number]  Date of birth: Nov 14, 1960  SUBJECTIVE:  Including CC & ROS.  Chief Complaint  Patient presents with  . Injections    hymovis #2    Miguel Bean is a 58 y.o. male that is presenting for his second hymovis injection.   Review of Systems  HISTORY: Past Medical, Surgical, Social, and Family History Reviewed & Updated per EMR.   Pertinent Historical Findings include:  Past Medical History:  Diagnosis Date  . Back pain   . PONV (postoperative nausea and vomiting)     Past Surgical History:  Procedure Laterality Date  . BACK SURGERY  08  . LUMBAR LAMINECTOMY  06/22/2011   Procedure: MICRODISCECTOMY LUMBAR LAMINECTOMY;  Surgeon: Marybelle Killings, MD;  Location: [place];  Service: Orthopedics;  Laterality: Left;  Left L5-S1 Microdiscectomy  . VASECTOMY     96    No Known Allergies  No family history on file.   Social History   Socioeconomic History  . Marital status: Married    Spouse name: Not on file  . Number of children: Not on file  . Years of education: Not on file  . Highest education level: Not on file  Occupational History  . Not on file  Social Needs  . Financial resource strain: Not on file  . Food insecurity    Worry: Not on file    Inability: Not on file  . Transportation needs    Medical: Not on file    Non-medical: Not on file  Tobacco Use  . Smoking status: Never Smoker  . Smokeless tobacco: Never Used  Substance and Sexual Activity  . Alcohol use: No  . Drug use: No  . Sexual activity: Yes  Lifestyle  . Physical activity    Days per week: Not on file    Minutes per session: Not on file  . Stress: Not on file  Relationships  . Social Herbalist on phone: Not on file    Gets together: Not on file    Attends religious service: Not on file    Active member of club or organization: Not on file    Attends meetings of clubs or organizations: Not on file    Relationship status: Not on file  .  Intimate partner violence    Fear of current or ex partner: Not on file    Emotionally abused: Not on file    Physically abused: Not on file    Forced sexual activity: Not on file  Other Topics Concern  . Not on file  Social History Narrative  . Not on file     PHYSICAL EXAM:  VS: There were no vitals taken for this visit. Physical Exam Gen: NAD, alert, cooperative with exam, well-appearing    Aspiration/Injection Procedure Note Miguel Bean March 31, 1961  Procedure: Injection Indications: left knee pain   Procedure Details Consent: Risks of procedure as well as the alternatives and risks of each were explained to the (patient/caregiver).  Consent for procedure obtained. Time Out: Verified patient identification, verified procedure, site/side was marked, verified correct patient position, special equipment/implants available, medications/allergies/relevent history reviewed, required imaging and test results available.  Performed.  The area was cleaned with iodine and alcohol swabs.    The left knee superior lateral suprapatellar pouch was injected using 4 cc's of 1% lidocaine with a 22 1 1/2" needle.  The syringe was switched and a 24 mg/3 mL of  Hymovis was injected. Ultrasound was used. Images were obtained in  Long views showing the injection.    A sterile dressing was applied.  Patient did tolerate procedure well.      ASSESSMENT & PLAN:   Primary osteoarthritis of left knee Completed his series of Hymovis  - patient supplied Hyomvis  - counseled on supportive care - can f/u in 4 weeks.

## 2018-11-10 NOTE — Assessment & Plan Note (Signed)
Completed his series of Hymovis  - patient supplied Hyomvis  - counseled on supportive care - can f/u in 4 weeks.

## 2018-11-10 NOTE — Patient Instructions (Signed)
Good to see you  Take tylenol 650 mg three times a day is the best evidence based medicine we have for arthritis.  Glucosamine sulfate 750mg  twice a day is a supplement that has been shown to help moderate to severe arthritis. Vitamin D 2000 IU daily Fish oil 2 grams daily.  Tumeric 500mg  twice daily.  Capsaicin topically up to four times a day may also help with pain.   Please send me a message in MyChart with any questions or updates.  Please see me back in 4 weeks if needed.   --Dr. Raeford Razor

## 2018-11-11 ENCOUNTER — Ambulatory Visit: Payer: BC Managed Care – PPO | Admitting: Physician Assistant

## 2018-11-11 ENCOUNTER — Encounter: Payer: Self-pay | Admitting: Physician Assistant

## 2018-11-11 ENCOUNTER — Other Ambulatory Visit: Payer: Self-pay

## 2018-11-11 DIAGNOSIS — F331 Major depressive disorder, recurrent, moderate: Secondary | ICD-10-CM

## 2018-11-11 DIAGNOSIS — G4700 Insomnia, unspecified: Secondary | ICD-10-CM | POA: Diagnosis not present

## 2018-11-11 MED ORDER — SERTRALINE HCL 100 MG PO TABS: 100.0000 mg | ORAL_TABLET | Freq: Every day | ORAL | 1 refills | Status: DC

## 2018-11-11 MED ORDER — TRAZODONE HCL 100 MG PO TABS: 100.0000 mg | ORAL_TABLET | Freq: Every evening | ORAL | 1 refills | Status: DC | PRN

## 2018-11-11 NOTE — Progress Notes (Signed)
[facility]  Patient ID: Miguel Bean,  MRN: [id number]  PCP: Helane Rima, MD  Date of Evaluation: 11/11/2018 Time spent:15 minutes  Chief Complaint:  Chief Complaint    Insomnia; Depression; Follow-up      HISTORY/CURRENT STATUS: HPI For routine med check.  Only problem is that he sometimes wakes up around 4:30 in the morning, and can't go back to sleep. On those nights, he gets about 5-6 hours.  Goes to sleep fine.  Feels that the trazodone helps but is just not enough.  Wonders if he could increase the dose.  Patient denies loss of interest in usual activities and is able to enjoy things.  Denies decreased energy or motivation.  Appetite has not changed.  No extreme sadness, tearfulness, or feelings of hopelessness.  Denies any changes in concentration, making decisions or remembering things.  Denies suicidal or homicidal thoughts.  Anxiety is not much of an issue.  Occasionally if something triggers it he will feel panicky.  But it usually goes away quickly.  Denies dizziness, syncope, seizures, numbness, tingling, tremor, tics, unsteady gait, slurred speech, confusion. Denies muscle or joint pain, stiffness, or dystonia.  Individual Medical History/ Review of Systems: Changes? :No    Past medications for mental health diagnoses include: Zoloft, Trazodone.  Allergies: Patient has no known allergies.  Current Medications:  Current Outpatient Medications:  .  Diclofenac Sodium (PENNSAID) 2 % SOLN, Place 1 application onto the skin 2 (two) times daily., Disp: 1 Bottle, Rfl: 3 .  sertraline (ZOLOFT) 100 MG tablet, Take 1 tablet (100 mg total) by mouth daily., Disp: 90 tablet, Rfl: 1 .  meloxicam (MOBIC) 15 MG tablet, Take 15 mg by mouth daily., Disp: , Rfl:  .  traZODone (DESYREL) 100 MG tablet, Take 1-2 tablets (100-200 mg total) by mouth at bedtime as needed for sleep., Disp: 180 tablet, Rfl: 1 Medication Side Effects: none  Family Medical/ Social History:  Changes? No  MENTAL HEALTH EXAM:  There were no vitals taken for this visit.There is no height or weight on file to calculate BMI.  General Appearance: Casual and Obese  Eye Contact:  Good  Speech:  Clear and Coherent  Volume:  Normal  Mood:  Euthymic  Affect:  Appropriate  Thought Process:  Goal Directed  Orientation:  Full (Time, Place, and Person)  Thought Content: Logical   Suicidal Thoughts:  No  Homicidal Thoughts:  No  Memory:  WNL  Judgement:  Good  Insight:  Good  Psychomotor Activity:  Normal  Concentration:  Concentration: Good  Recall:  Good  Fund of Knowledge: Good  Language: Good  Assets:  Desire for Improvement  ADL's:  Intact  Cognition: WNL  Prognosis:  Good    DIAGNOSES:    ICD-10-CM   1. Major depressive disorder, recurrent episode, moderate (HCC)  F33.1   2. Insomnia, unspecified type  G47.00     Receiving Psychotherapy: No    RECOMMENDATIONS:  Continue Zoloft 100 mg p.o. daily. Increase trazodone 100 mg, 1-2 nightly as needed. Sleep hygiene discussed. Return in 6 months.  Donnal Moat, PA-C   This record has been created using [employer].  Chart creation errors have been sought, but may not always have been located and corrected. Such creation errors do not reflect on the standard of medical care.

## 2018-11-14 ENCOUNTER — Encounter: Payer: Self-pay | Admitting: Physician Assistant

## 2019-02-01 DIAGNOSIS — Z23 Encounter for immunization: Secondary | ICD-10-CM | POA: Diagnosis not present

## 2019-02-01 DIAGNOSIS — S161XXA Strain of muscle, fascia and tendon at neck level, initial encounter: Secondary | ICD-10-CM | POA: Diagnosis not present

## 2019-02-13 ENCOUNTER — Encounter: Payer: Self-pay | Admitting: Family Medicine

## 2019-02-18 ENCOUNTER — Encounter: Payer: Self-pay | Admitting: Family Medicine

## 2019-02-18 ENCOUNTER — Other Ambulatory Visit: Payer: Self-pay

## 2019-02-18 ENCOUNTER — Ambulatory Visit: Payer: BC Managed Care – PPO | Admitting: Family Medicine

## 2019-02-18 VITALS — Ht 72.0 in | Wt 313.0 lb

## 2019-02-18 DIAGNOSIS — M5412 Radiculopathy, cervical region: Secondary | ICD-10-CM

## 2019-02-18 MED ORDER — PREDNISONE 5 MG PO TABS: ORAL_TABLET | ORAL | 0 refills | Status: DC

## 2019-02-18 MED ORDER — BACLOFEN 10 MG PO TABS: 10.0000 mg | ORAL_TABLET | Freq: Two times a day (BID) | ORAL | 0 refills | Status: DC | PRN

## 2019-02-18 NOTE — Assessment & Plan Note (Signed)
Symptoms seem most consistent with cervical radiculopathy.  Less likely for ulnar neuropathy. -Prednisone. -Baclofen. -Counseled on home exercise therapy and supportive care. -Counseled on posture. -If no improvement consider imaging and physical therapy.

## 2019-02-18 NOTE — Progress Notes (Signed)
Miguel Bean - 58 y.o. male MRN [id number]  Date of birth: 01-31-1961  SUBJECTIVE:  Including CC & ROS.  Chief Complaint  Patient presents with  . Arm Pain    right arm    Miguel Bean is a 58 y.o. male that is presenting with right arm pain and numbness.  The symptoms been intermittent over the past few months.  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  His symptoms are occurring over the ulnar aspect and dorsal aspect of the forearm and hand.  Denies any inciting event or trauma.  Symptoms are intermittent.  It is a tingling and burning sensation that extends up his arm to the neck.  Has tried prednisone already.  Denies any previous surgery on the neck.   Review of Systems  Constitutional: Negative for fever.  HENT: Negative for congestion and dental problem.   Respiratory: Negative for cough.   Cardiovascular: Negative for chest pain.  Gastrointestinal: Negative for abdominal pain.  Endocrine: Negative for polydipsia.  Musculoskeletal: Positive for arthralgias and back pain.  Allergic/Immunologic: Negative for environmental allergies and food allergies.  Neurological: Positive for numbness.  Hematological: Negative for adenopathy.    HISTORY: Past Medical, Surgical, Social, and Family History Reviewed & Updated per EMR.   Pertinent Historical Findings include:  Past Medical History:  Diagnosis Date  . Back pain   . PONV (postoperative nausea and vomiting)     Past Surgical History:  Procedure Laterality Date  . BACK SURGERY  08  . LUMBAR LAMINECTOMY  06/22/2011   Procedure: MICRODISCECTOMY LUMBAR LAMINECTOMY;  Surgeon: Marybelle Killings, MD;  Location: [facility];  Service: Orthopedics;  Laterality: Left;  Left L5-S1 Microdiscectomy  . VASECTOMY     96    No Known Allergies  No family history on file.   Social History   Socioeconomic History  . Marital status: Married    Spouse name: Not on file  . Number of children: Not on file  . Years of  education: Not on file  . Highest education level: Not on file  Occupational History  . Not on file  Social Needs  . Financial resource strain: Not on file  . Food insecurity    Worry: Not on file    Inability: Not on file  . Transportation needs    Medical: Not on file    Non-medical: Not on file  Tobacco Use  . Smoking status: Never Smoker  . Smokeless tobacco: Never Used  Substance and Sexual Activity  . Alcohol use: No  . Drug use: No  . Sexual activity: Yes  Lifestyle  . Physical activity    Days per week: Not on file    Minutes per session: Not on file  . Stress: Not on file  Relationships  . Social Herbalist on phone: Not on file    Gets together: Not on file    Attends religious service: Not on file    Active member of club or organization: Not on file    Attends meetings of clubs or organizations: Not on file    Relationship status: Not on file  . Intimate partner violence    Fear of current or ex partner: Not on file    Emotionally abused: Not on file    Physically abused: Not on file    Forced sexual activity: Not on file  Other Topics Concern  . Not on file  Social  History Narrative  . Not on file     PHYSICAL EXAM:  VS: Ht 6' (1.829 m)   Wt (!) 313 lb (142 kg)   BMI 42.45 kg/m  Physical Exam Gen: NAD, alert, cooperative with exam, well-appearing ENT: normal lips, normal nasal mucosa,  Eye: normal EOM, normal conjunctiva and lids CV:  no edema, +2 pedal pulses   Resp: no accessory muscle use, non-labored,  Skin: no rashes, no areas of induration  Neuro: normal tone, normal sensation to touch Psych:  normal insight, alert and oriented MSK:  Neck/right shoulder: Normal neck range of motion. Normal strength resistance with shrug. Normal shoulder abduction and flexion actively. No signs of atrophy. Normal grip strength. Normal pincer grasp. Normal strength resisted with thumb extension. Normal strength resisted finger abduction and  abduction. Positive Spurling's test. Neurovascular intact     ASSESSMENT & PLAN:   Cervical radiculopathy Symptoms seem most consistent with cervical radiculopathy.  Less likely for ulnar neuropathy. -Prednisone. -Baclofen. -Counseled on home exercise therapy and supportive care. -Counseled on posture. -If no improvement consider imaging and physical therapy.

## 2019-02-18 NOTE — Patient Instructions (Signed)
Good to see you Please avoid working at the computer for longer than 45 minutes  Please try the exercises  Please try the medicine   Please send me a message in [place] with any questions or updates.  Please see me back in 4 weeks.   --Dr. Raeford Razor

## 2019-03-18 ENCOUNTER — Other Ambulatory Visit: Payer: Self-pay

## 2019-03-18 ENCOUNTER — Ambulatory Visit: Payer: BC Managed Care – PPO | Admitting: Family Medicine

## 2019-03-18 ENCOUNTER — Encounter: Payer: Self-pay | Admitting: Family Medicine

## 2019-03-18 DIAGNOSIS — M5412 Radiculopathy, cervical region: Secondary | ICD-10-CM

## 2019-03-18 NOTE — Assessment & Plan Note (Signed)
Feels like his symptoms have improved at this time.  May be related to working less in the posture that he was using in his a work environment. -Counseled on home exercise therapy and supportive care. -Could consider physical therapy or cervical x-rays.

## 2019-03-18 NOTE — Progress Notes (Signed)
Miguel Bean - 58 y.o. male MRN [id number]  Date of birth: 1960/07/17  SUBJECTIVE:  Including CC & ROS.  Chief Complaint  Patient presents with  . Follow-up    follow up for neck    Miguel Bean is a 58 y.o. male that is following up for his cervical radiculopathy.  He reports significant improvement with the prednisone.  No longer has any altered sensation or pain in his right arm.  Intermittently has sensational changes in the ulnar aspect of his right hand.  These are infrequent.  He is working less as well.  Has good range of motion with no specific exacerbating events.   Review of Systems  Constitutional: Negative for fever.  HENT: Negative for congestion.   Respiratory: Negative for cough.   Cardiovascular: Negative for chest pain.  Gastrointestinal: Negative for abdominal pain.  Musculoskeletal: Positive for arthralgias and back pain.  Skin: Negative for color change.  Neurological: Negative for weakness.  Hematological: Negative for adenopathy.    HISTORY: Past Medical, Surgical, Social, and Family History Reviewed & Updated per EMR.   Pertinent Historical Findings include:  Past Medical History:  Diagnosis Date  . Back pain   . PONV (postoperative nausea and vomiting)     Past Surgical History:  Procedure Laterality Date  . BACK SURGERY  08  . LUMBAR LAMINECTOMY  06/22/2011   Procedure: MICRODISCECTOMY LUMBAR LAMINECTOMY;  Surgeon: Marybelle Killings, MD;  Location: [facility];  Service: Orthopedics;  Laterality: Left;  Left L5-S1 Microdiscectomy  . VASECTOMY     96    No Known Allergies  No family history on file.   Social History   Socioeconomic History  . Marital status: Married    Spouse name: Not on file  . Number of children: Not on file  . Years of education: Not on file  . Highest education level: Not on file  Occupational History  . Not on file  Social Needs  . Financial resource strain: Not on file  . Food insecurity    Worry: Not on file   Inability: Not on file  . Transportation needs    Medical: Not on file    Non-medical: Not on file  Tobacco Use  . Smoking status: Never Smoker  . Smokeless tobacco: Never Used  Substance and Sexual Activity  . Alcohol use: No  . Drug use: No  . Sexual activity: Yes  Lifestyle  . Physical activity    Days per week: Not on file    Minutes per session: Not on file  . Stress: Not on file  Relationships  . Social Herbalist on phone: Not on file    Gets together: Not on file    Attends religious service: Not on file    Active member of club or organization: Not on file    Attends meetings of clubs or organizations: Not on file    Relationship status: Not on file  . Intimate partner violence    Fear of current or ex partner: Not on file    Emotionally abused: Not on file    Physically abused: Not on file    Forced sexual activity: Not on file  Other Topics Concern  . Not on file  Social History Narrative  . Not on file     PHYSICAL EXAM:  VS: BP (!) 142/84   Pulse 78   Ht 6' (1.829 m)   Wt (!) 317 lb (143.8 kg)  BMI 42.99 kg/m  Physical Exam Gen: NAD, alert, cooperative with exam, well-appearing ENT: normal lips, normal nasal mucosa,  Eye: normal EOM, normal conjunctiva and lids CV:  no edema, +2 pedal pulses   Resp: no accessory muscle use, non-labored,  Skin: no rashes, no areas of induration  Neuro: normal tone, normal sensation to touch Psych:  normal insight, alert and oriented MSK:  Neck: Normal range of motion in flexion and extension. Normal lateral rotation. Normal strength resistance with shrug. Normal right shoulder active flexion and abduction. Normal grip strength. Neurovascularly intact     ASSESSMENT & PLAN:   Cervical radiculopathy Feels like his symptoms have improved at this time.  May be related to working less in the posture that he was using in his a work environment. -Counseled on home exercise therapy and supportive  care. -Could consider physical therapy or cervical x-rays.

## 2019-05-12 ENCOUNTER — Ambulatory Visit: Payer: BC Managed Care – PPO | Admitting: Physician Assistant

## 2019-05-13 ENCOUNTER — Other Ambulatory Visit: Payer: Self-pay | Admitting: Physician Assistant

## 2019-06-04 ENCOUNTER — Encounter: Payer: Self-pay | Admitting: Physician Assistant

## 2019-06-04 ENCOUNTER — Other Ambulatory Visit: Payer: Self-pay

## 2019-06-04 ENCOUNTER — Ambulatory Visit (INDEPENDENT_AMBULATORY_CARE_PROVIDER_SITE_OTHER): Payer: BC Managed Care – PPO | Admitting: Physician Assistant

## 2019-06-04 DIAGNOSIS — G4700 Insomnia, unspecified: Secondary | ICD-10-CM | POA: Diagnosis not present

## 2019-06-04 DIAGNOSIS — F3341 Major depressive disorder, recurrent, in partial remission: Secondary | ICD-10-CM

## 2019-06-04 MED ORDER — MELATONIN 3-10 MG PO TABS: 5.0000 mg | ORAL_TABLET | Freq: Every evening | ORAL | 0 refills | Status: DC | PRN

## 2019-06-04 NOTE — Progress Notes (Signed)
[facility]  Patient ID: Miguel Bean,  MRN: [id number]  PCP: Patient, No Pcp Per  Date of Evaluation: 06/04/2019 Time spent:15 minutes  Chief Complaint:  Chief Complaint    Depression; Insomnia      HISTORY/CURRENT STATUS: HPI For 6 month med check.  Still having trouble sleeping sometimes. He does take the Trazodone, sometimes even 2 pills, and it doesn't work. No caffeine after one cup of coffee in the morning. Trouble going to sleep not staying asleep. Not using the computer late at night.  Patient denies loss of interest in usual activities and is able to enjoy things.  Denies decreased energy or motivation.  Appetite has not changed.  No extreme sadness, tearfulness, or feelings of hopelessness.  Denies any changes in concentration, making decisions or remembering things.  Denies suicidal or homicidal thoughts.  Denies anxiety now.  Work is going well.  Works from home.   Denies dizziness, syncope, seizures, numbness, tingling, tremor, tics, unsteady gait, slurred speech, confusion. Denies muscle or joint pain, stiffness, or dystonia.  Individual Medical History/ Review of Systems: Changes? :No    Past medications for mental health diagnoses include: Zoloft, Trazodone.  Allergies: Patient has no known allergies.  Current Medications:  Current Outpatient Medications:  .  sertraline (ZOLOFT) 100 MG tablet, Take 1 tablet (100 mg total) by mouth daily., Disp: 90 tablet, Rfl: 1 .  traZODone (DESYREL) 100 MG tablet, TAKE 1-2 TABLETS (100-200 MG TOTAL) BY MOUTH AT BEDTIME AS NEEDED FOR SLEEP., Disp: 180 tablet, Rfl: 0 .  baclofen (LIORESAL) 10 MG tablet, Take 1 tablet (10 mg total) by mouth 2 (two) times daily as needed for muscle spasms. (Patient not taking: Reported on 06/04/2019), Disp: 20 each, Rfl: 0 .  Diclofenac Sodium (PENNSAID) 2 % SOLN, Place 1 application onto the skin 2 (two) times daily. (Patient not taking: Reported on 06/04/2019), Disp: 1 Bottle, Rfl: 3 .   meloxicam (MOBIC) 15 MG tablet, Take 15 mg by mouth daily., Disp: , Rfl:  .  predniSONE (DELTASONE) 5 MG tablet, Take 6 pills for first day, 5 pills second day, 4 pills third day, 3 pills fourth day, 2 pills the fifth day, and 1 pill sixth day. (Patient not taking: Reported on 06/04/2019), Disp: 21 tablet, Rfl: 0 Medication Side Effects: none  Family Medical/ Social History: Changes? Yes he and wife moved recently to a 4 bedroom house.   MENTAL HEALTH EXAM:  There were no vitals taken for this visit.There is no height or weight on file to calculate BMI.  General Appearance: Casual, Neat, Well Groomed and Obese  Eye Contact:  Good  Speech:  Clear and Coherent  Volume:  Normal  Mood:  Euthymic  Affect:  Appropriate  Thought Process:  Goal Directed and Descriptions of Associations: Intact  Orientation:  Full (Time, Place, and Person)  Thought Content: Logical   Suicidal Thoughts:  No  Homicidal Thoughts:  No  Memory:  WNL  Judgement:  Good  Insight:  Good  Psychomotor Activity:  Normal  Concentration:  Concentration: Good  Recall:  Good  Fund of Knowledge: Good  Language: Good  Assets:  Desire for Improvement  ADL's:  Intact  Cognition: WNL  Prognosis:  Good    DIAGNOSES:    ICD-10-CM   1. Insomnia, unspecified type  G47.00   2. Recurrent major depressive disorder, in partial remission ([place])  F33.41     Receiving Psychotherapy: No    RECOMMENDATIONS:  Sleep hygeine discussed.  Museum/gallery conservator  colored glasses Disc Mirtazepine, or Ambien- we discussed both meds and will Rx if needed.  He can call and will send in. He prefers not to use controlled substances unless absolutely necessary.  Start Melatonin OTC 5 mg, 1/2-2 qhs prn about 20 mins before sleep.  Cont Trazodone 100mg -200 mg qhs prn.Ok to take it w/ Melatonin. Cont Zoloft 100 mg 1 qd. Return in 6 months.   Donnal Moat, PA-C

## 2019-06-16 DIAGNOSIS — Z03818 Encounter for observation for suspected exposure to other biological agents ruled out: Secondary | ICD-10-CM | POA: Diagnosis not present

## 2019-06-16 DIAGNOSIS — Z20828 Contact with and (suspected) exposure to other viral communicable diseases: Secondary | ICD-10-CM | POA: Diagnosis not present

## 2019-06-19 ENCOUNTER — Other Ambulatory Visit: Payer: Self-pay | Admitting: Physician Assistant

## 2019-08-10 ENCOUNTER — Other Ambulatory Visit: Payer: Self-pay | Admitting: Physician Assistant

## 2019-09-19 ENCOUNTER — Ambulatory Visit: Payer: BC Managed Care – PPO | Attending: Internal Medicine

## 2019-09-19 DIAGNOSIS — Z23 Encounter for immunization: Secondary | ICD-10-CM

## 2019-09-19 NOTE — Progress Notes (Signed)
   Covid-19 Vaccination Clinic  Name:  Miguel Bean    MRN: [id number] DOB: [birth year]  09/19/2019  [name] was observed post Covid-19 immunization for 15 minutes without incident. He was provided with Vaccine Information Sheet and instruction to access the V-Safe system.   [name] was instructed to call 911 with any severe reactions post vaccine: Marland Kitchen Difficulty breathing  . Swelling of face and throat  . A fast heartbeat  . A bad rash all over body  . Dizziness and weakness   Immunizations Administered    Name Date Dose VIS Date Route   Pfizer COVID-19 Vaccine 09/19/2019  2:05 PM 0.3 mL 07/22/2018 Intramuscular   Manufacturer: [place]   Lot: [id number]   [place]: [id number]

## 2019-10-12 ENCOUNTER — Ambulatory Visit: Payer: BC Managed Care – PPO | Attending: Internal Medicine

## 2019-10-12 DIAGNOSIS — Z23 Encounter for immunization: Secondary | ICD-10-CM

## 2019-10-12 NOTE — Progress Notes (Signed)
   Covid-19 Vaccination Clinic  Name:  Miguel Bean    MRN: [id number] DOB: 01/20/1961  10/12/2019  [name] was observed post Covid-19 immunization for 15 minutes without incident. He was provided with Vaccine Information Sheet and instruction to access the V-Safe system.   [name] was instructed to call 911 with any severe reactions post vaccine: Marland Kitchen Difficulty breathing  . Swelling of face and throat  . A fast heartbeat  . A bad rash all over body  . Dizziness and weakness   Immunizations Administered    Name Date Dose VIS Date Route   Pfizer COVID-19 Vaccine 10/12/2019  4:07 PM 0.3 mL 07/22/2018 Intramuscular   Manufacturer: [place]   Lot: [id number]   [place]: [id number]

## 2019-11-15 ENCOUNTER — Other Ambulatory Visit: Payer: Self-pay | Admitting: Physician Assistant

## 2019-12-02 ENCOUNTER — Ambulatory Visit (INDEPENDENT_AMBULATORY_CARE_PROVIDER_SITE_OTHER): Payer: BC Managed Care – PPO | Admitting: Physician Assistant

## 2019-12-02 ENCOUNTER — Other Ambulatory Visit: Payer: Self-pay

## 2019-12-02 ENCOUNTER — Encounter: Payer: Self-pay | Admitting: Physician Assistant

## 2019-12-02 DIAGNOSIS — G4700 Insomnia, unspecified: Secondary | ICD-10-CM | POA: Diagnosis not present

## 2019-12-02 DIAGNOSIS — F3341 Major depressive disorder, recurrent, in partial remission: Secondary | ICD-10-CM | POA: Diagnosis not present

## 2019-12-02 MED ORDER — SERTRALINE HCL 100 MG PO TABS: 100.0000 mg | ORAL_TABLET | Freq: Every day | ORAL | 1 refills | Status: DC

## 2019-12-02 MED ORDER — MIRTAZAPINE 7.5 MG PO TABS: 7.5000 mg | ORAL_TABLET | Freq: Every evening | ORAL | 1 refills | Status: DC | PRN

## 2019-12-02 NOTE — Progress Notes (Signed)
[facility]  Patient ID: Miguel Bean,  MRN: [id number]  PCP: Patient, No Pcp Per  Date of Evaluation: 12/02/2019 Time spent:20 minutes  Chief Complaint:  Chief Complaint    Insomnia; Depression      HISTORY/CURRENT STATUS: HPI For 6 month med check.  Still having trouble sleeping. Has for many years.  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 "I think I'll always be that way." Trazodone does not really help much.  Does not want to take controlled substance unless absolutely necessary.  "I would like some sleep." Denies anxiety now.  Work is going well.  Works from home.   Patient denies loss of interest in usual activities and is able to enjoy things.  Denies decreased energy or motivation. No extreme sadness, tearfulness, or feelings of hopelessness. Denies suicidal or homicidal thoughts.  Denies dizziness, syncope, seizures, numbness, tingling, tremor, tics, unsteady gait, slurred speech, confusion. Denies muscle or joint pain, stiffness, or dystonia.  Individual Medical History/ Review of Systems: Changes? :Yes  He had covid in Feb. Recovered well.    Past medications for mental health diagnoses include: Zoloft, Melatonin, Trazodone  Allergies: Patient has no known allergies.  Current Medications:  Current Outpatient Medications:    sertraline (ZOLOFT) 100 MG tablet, Take 1 tablet (100 mg total) by mouth daily., Disp: 90 tablet, Rfl: 1   baclofen (LIORESAL) 10 MG tablet, Take 1 tablet (10 mg total) by mouth 2 (two) times daily as needed for muscle spasms. (Patient not taking: Reported on 06/04/2019), Disp: 20 each, Rfl: 0   Diclofenac Sodium (PENNSAID) 2 % SOLN, Place 1 application onto the skin 2 (two) times daily. (Patient not taking: Reported on 06/04/2019), Disp: 1 Bottle, Rfl: 3   Melatonin 3-10 MG TABS, Take 5 mg by mouth at bedtime as needed. (Patient not taking: Reported on 12/02/2019), Disp: 30 tablet, Rfl: 0    meloxicam (MOBIC) 15 MG tablet, Take 15 mg by mouth daily., Disp: , Rfl:    mirtazapine (REMERON) 7.5 MG tablet, Take 1-2 tablets (7.5-15 mg total) by mouth at bedtime as needed., Disp: 60 tablet, Rfl: 1   predniSONE (DELTASONE) 5 MG tablet, Take 6 pills for first day, 5 pills second day, 4 pills third day, 3 pills fourth day, 2 pills the fifth day, and 1 pill sixth day. (Patient not taking: Reported on 06/04/2019), Disp: 21 tablet, Rfl: 0 Medication Side Effects: none  Family Medical/ Social History: Changes? Grandson born in April.   MENTAL HEALTH EXAM:  There were no vitals taken for this visit.There is no height or weight on file to calculate BMI.  General Appearance: Casual, Neat, Well Groomed and Obese  Eye Contact:  Good  Speech:  Clear and Coherent and Normal Rate  Volume:  Normal  Mood:  Euthymic  Affect:  Appropriate  Thought Process:  Goal Directed and Descriptions of Associations: Intact  Orientation:  Full (Time, Place, and Person)  Thought Content: Logical   Suicidal Thoughts:  No  Homicidal Thoughts:  No  Memory:  WNL  Judgement:  Good  Insight:  Good  Psychomotor Activity:  Normal  Concentration:  Concentration: Good and Attention Span: Good  Recall:  Good  Fund of Knowledge: Good  Language: Good  Assets:  Desire for Improvement  ADL's:  Intact  Cognition: WNL  Prognosis:  Good    DIAGNOSES:    ICD-10-CM   1. Insomnia, unspecified type  G47.00   2. Recurrent major depressive disorder, in partial remission ([place])  F33.41     Receiving Psychotherapy: No    RECOMMENDATIONS:  PDMP reviewed.  I provided 20 minutes face to face time during this encounter. We discussed sleep hygiene, different medications as options for treatment.  I recommend starting mirtazapine.  Hopefully that will help better than the trazodone.  We may have to resort to a controlled substance.  Sleep is really important in physical and mental health and we need to find a solution for him  to get at least 7 hours. Discontinue trazodone. Start mirtazapine 7.5 mg, 1-2 nightly as needed. Cont Zoloft 100 mg 1 qd. Return in 6 weeks.  Donnal Moat, PA-C

## 2019-12-26 ENCOUNTER — Other Ambulatory Visit: Payer: Self-pay | Admitting: Physician Assistant

## 2020-01-15 ENCOUNTER — Ambulatory Visit: Payer: BC Managed Care – PPO | Admitting: Physician Assistant

## 2020-02-09 ENCOUNTER — Ambulatory Visit (INDEPENDENT_AMBULATORY_CARE_PROVIDER_SITE_OTHER): Payer: BC Managed Care – PPO | Admitting: Physician Assistant

## 2020-02-09 ENCOUNTER — Other Ambulatory Visit: Payer: Self-pay

## 2020-02-09 ENCOUNTER — Encounter: Payer: Self-pay | Admitting: Physician Assistant

## 2020-02-09 DIAGNOSIS — F3341 Major depressive disorder, recurrent, in partial remission: Secondary | ICD-10-CM

## 2020-02-09 DIAGNOSIS — G4700 Insomnia, unspecified: Secondary | ICD-10-CM

## 2020-02-09 MED ORDER — TRAZODONE HCL 100 MG PO TABS: 100.0000 mg | ORAL_TABLET | Freq: Every evening | ORAL | 5 refills | Status: DC | PRN

## 2020-02-09 MED ORDER — ZALEPLON 10 MG PO CAPS: ORAL_CAPSULE | ORAL | 1 refills | Status: DC

## 2020-02-09 NOTE — Progress Notes (Signed)
[facility]  Patient ID: Miguel Bean,  MRN: [id number]  PCP: Patient, No Pcp Per  Date of Evaluation: 02/09/2020 Time spent:20 minutes  Chief Complaint:  Chief Complaint    Insomnia; Depression      HISTORY/CURRENT STATUS: HPI For 6 week med check.  At [place] we stopped Trazodone and changed to Mirtazepine. It hasn't made any difference at all. States if he gets around 6 hours of sleep he's fine. Has had insomnia all his adult life. Prefers not to take any controlled substances if at all possible. The insomnia doesn't affect his work or his home life. He goes to bed at 10 and there are times when he lays awake all night.  And then there are some nights when he will go to sleep but then wakes up 2 or 3 hours later.  Those are the worst times because he will start to get sleepy around the time that he has to get up.  He does not drink caffeine after breakfast.  Patient denies loss of interest in usual activities and is able to enjoy things.  Denies decreased energy or motivation. No extreme sadness, tearfulness, or feelings of hopelessness. Denies suicidal or homicidal thoughts.  Patient denies increased energy with decreased need for sleep, no increased talkativeness, no racing thoughts, no impulsivity or risky behaviors, no increased spending, no increased libido, no grandiosity, no increased irritability or anger, and no hallucinations.  Denies dizziness, syncope, seizures, numbness, tingling, tremor, tics, unsteady gait, slurred speech, confusion. Denies muscle or joint pain, stiffness, or dystonia.  Individual Medical History/ Review of Systems: Changes? :No    Past medications for mental health diagnoses include: Zoloft, Melatonin, Trazodone  Allergies: Patient has no known allergies.  Current Medications:  Current Outpatient Medications:    sertraline (ZOLOFT) 100 MG tablet, Take 1 tablet (100 mg total) by mouth daily., Disp: 90 tablet, Rfl: 1   baclofen  (LIORESAL) 10 MG tablet, Take 1 tablet (10 mg total) by mouth 2 (two) times daily as needed for muscle spasms. (Patient not taking: Reported on 06/04/2019), Disp: 20 each, Rfl: 0   Diclofenac Sodium (PENNSAID) 2 % SOLN, Place 1 application onto the skin 2 (two) times daily. (Patient not taking: Reported on 06/04/2019), Disp: 1 Bottle, Rfl: 3   Melatonin 3-10 MG TABS, Take 5 mg by mouth at bedtime as needed. (Patient not taking: Reported on 12/02/2019), Disp: 30 tablet, Rfl: 0   meloxicam (MOBIC) 15 MG tablet, Take 15 mg by mouth daily., Disp: , Rfl:    predniSONE (DELTASONE) 5 MG tablet, Take 6 pills for first day, 5 pills second day, 4 pills third day, 3 pills fourth day, 2 pills the fifth day, and 1 pill sixth day. (Patient not taking: Reported on 06/04/2019), Disp: 21 tablet, Rfl: 0   traZODone (DESYREL) 100 MG tablet, Take 1-2 tablets (100-200 mg total) by mouth at bedtime as needed for sleep., Disp: 60 tablet, Rfl: 5   zaleplon (SONATA) 10 MG capsule, 1 po qhs prn, and may repeat 1 for midnocturnal awakening as long as there are 3 hours left to sleep., Disp: 30 capsule, Rfl: 1 Medication Side Effects: none  Family Medical/ Social History: Changes?  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  He is not necessarily worried about it.  "Something always comes."  MENTAL HEALTH EXAM:  There were no vitals taken for this visit.There is no height or weight on file to calculate BMI.  General Appearance: Casual, Neat, Well Groomed and Obese  Eye Contact:  Good  Speech:  Clear and Coherent and Normal Rate  Volume:  Normal  Mood:  Euthymic  Affect:  Appropriate  Thought Process:  Goal Directed and Descriptions of Associations: Intact  Orientation:  Full (Time, Place, and Person)  Thought Content: Logical   Suicidal Thoughts:  No  Homicidal Thoughts:  No  Memory:  WNL  Judgement:  Good  Insight:  Good  Psychomotor Activity:  Normal  Concentration:  Concentration: Good  and Attention Span: Good  Recall:  Good  Fund of Knowledge: Good  Language: Good  Assets:  Desire for Improvement  ADL's:  Intact  Cognition: WNL  Prognosis:  Good    DIAGNOSES:    ICD-10-CM   1. Insomnia, unspecified type  G47.00   2. Recurrent major depressive disorder, in partial remission ([place])  F33.41     Receiving Psychotherapy: No    RECOMMENDATIONS:  PDMP reviewed.  I provided 20 minutes face to face time during this encounter. We discussed sleep hygiene.  He will go back to the trazodone because it worked just as well as the mirtazapine has.  We also discussed using Ambien, Lunesta, Sonata, or any other prescription medication.  Even if he takes it only rarely, I think it would be beneficial for him to have something on hand.  He can take if he has not had a good night sleep and 3-4 nights.  We discussed benefits, risks, side effects, including sleepwalking, sleep eating, or sleep driving.  He understands and agrees to proceed. Restart trazodone 100 mg, 1-2 nightly as needed sleep. Start Sonata 10 mg, 1 p.o. nightly as needed and may repeat 1 for mid nocturnal awakening as needed as long as he has 3 hours left to sleep. Cont Zoloft 100 mg 1 qd. Return in 3 months.  Donnal Moat, PA-C

## 2020-05-10 ENCOUNTER — Ambulatory Visit: Payer: BC Managed Care – PPO | Admitting: Physician Assistant

## 2020-05-18 ENCOUNTER — Telehealth (INDEPENDENT_AMBULATORY_CARE_PROVIDER_SITE_OTHER): Payer: BC Managed Care – PPO | Admitting: Physician Assistant

## 2020-05-18 ENCOUNTER — Encounter: Payer: Self-pay | Admitting: Physician Assistant

## 2020-05-18 ENCOUNTER — Telehealth: Payer: Self-pay | Admitting: Physician Assistant

## 2020-05-18 DIAGNOSIS — F3341 Major depressive disorder, recurrent, in partial remission: Secondary | ICD-10-CM | POA: Diagnosis not present

## 2020-05-18 DIAGNOSIS — G4700 Insomnia, unspecified: Secondary | ICD-10-CM

## 2020-05-18 DIAGNOSIS — F411 Generalized anxiety disorder: Secondary | ICD-10-CM

## 2020-05-18 MED ORDER — ZALEPLON 10 MG PO CAPS: ORAL_CAPSULE | ORAL | 5 refills | Status: DC

## 2020-05-18 MED ORDER — SERTRALINE HCL 100 MG PO TABS: 100.0000 mg | ORAL_TABLET | Freq: Every day | ORAL | 1 refills | Status: DC

## 2020-05-18 NOTE — Progress Notes (Addendum)
[facility]  Patient ID: Miguel Bean,  MRN: [id number]  PCP: Patient, No Pcp Per  Date of Evaluation: 05/18/2020 Time spent:20 minutes  Chief Complaint:  Chief Complaint    Depression; Insomnia; Anxiety     Virtual Visit via Telehealth  I connected with patient by a video enabled telemedicine application with their informed consent, and verified patient privacy and that I am speaking with the correct person using two identifiers.  I am private, in my office and the patient is at home.  I discussed the limitations, risks, security and privacy concerns of performing an evaluation and management service by video and the availability of in person appointments. I also discussed with the patient that there may be a patient responsible charge related to this service. The patient expressed understanding and agreed to proceed.   I discussed the assessment and treatment plan with the patient. The patient was provided an opportunity to ask questions and all were answered. The patient agreed with the plan and demonstrated an understanding of the instructions.   The patient was advised to call back or seek an in-person evaluation if the symptoms worsen or if the condition fails to improve as anticipated.  I provided 20 minutes of non-face-to-face time during this encounter.  HISTORY/CURRENT STATUS: HPI For 3 month med check.  Since we added the Sonata last OV and restarted Trazodone he's been sleeping much better. Able to go to sleep most of the time, but sometimes will wake up in the night and he has taken a Sonata then.  It has been very helpful.  Patient denies loss of interest in usual activities and is able to enjoy things.  Denies decreased energy or motivation. No extreme sadness, tearfulness, or feelings of hopelessness.  Work is going well.  He is able to focus and get things done in a timely manner.  Anxiety is well controlled.  Denies suicidal or homicidal  thoughts.  Patient denies increased energy with decreased need for sleep, no increased talkativeness, no racing thoughts, no impulsivity or risky behaviors, no increased spending, no increased libido, no grandiosity, no increased irritability or anger, and no hallucinations.  Denies dizziness, syncope, seizures, numbness, tingling, tremor, tics, unsteady gait, slurred speech, confusion. Denies muscle or joint pain, stiffness, or dystonia.  Individual Medical History/ Review of Systems: Changes? :No    Past medications for mental health diagnoses include: Zoloft, Melatonin, Trazodone  Allergies: Patient has no known allergies.  Current Medications:  Current Outpatient Medications:  .  traZODone (DESYREL) 100 MG tablet, Take 1-2 tablets (100-200 mg total) by mouth at bedtime as needed for sleep., Disp: 60 tablet, Rfl: 5 .  baclofen (LIORESAL) 10 MG tablet, Take 1 tablet (10 mg total) by mouth 2 (two) times daily as needed for muscle spasms. (Patient not taking: Reported on 06/04/2019), Disp: 20 each, Rfl: 0 .  Diclofenac Sodium (PENNSAID) 2 % SOLN, Place 1 application onto the skin 2 (two) times daily. (Patient not taking: Reported on 06/04/2019), Disp: 1 Bottle, Rfl: 3 .  Melatonin 3-10 MG TABS, Take 5 mg by mouth at bedtime as needed. (Patient not taking: Reported on 12/02/2019), Disp: 30 tablet, Rfl: 0 .  predniSONE (DELTASONE) 5 MG tablet, Take 6 pills for first day, 5 pills second day, 4 pills third day, 3 pills fourth day, 2 pills the fifth day, and 1 pill sixth day. (Patient not taking: Reported on 06/04/2019), Disp: 21 tablet, Rfl: 0 .  sertraline (ZOLOFT) 100 MG tablet, Take 1  tablet (100 mg total) by mouth daily., Disp: 90 tablet, Rfl: 1 .  zaleplon (SONATA) 10 MG capsule, 1 po qhs prn, and may repeat 1 for midnocturnal awakening as long as there are 3 hours left to sleep., Disp: 30 capsule, Rfl: 5 Medication Side Effects: none  Family Medical/ Social History: Changes?  No  MENTAL HEALTH  EXAM:  There were no vitals taken for this visit.There is no height or weight on file to calculate BMI.  General Appearance: Casual, Neat, Well Groomed and Obese  Eye Contact:  Good  Speech:  Clear and Coherent and Normal Rate  Volume:  Normal  Mood:  Euthymic  Affect:  Appropriate  Thought Process:  Goal Directed and Descriptions of Associations: Intact  Orientation:  Full (Time, Place, and Person)  Thought Content: Logical   Suicidal Thoughts:  No  Homicidal Thoughts:  No  Memory:  WNL  Judgement:  Good  Insight:  Good  Psychomotor Activity:  Normal  Concentration:  Concentration: Good and Attention Span: Good  Recall:  Good  Fund of Knowledge: Good  Language: Good  Assets:  Desire for Improvement  ADL's:  Intact  Cognition: WNL  Prognosis:  Good    DIAGNOSES:    ICD-10-CM   1. Insomnia, unspecified type  G47.00   2. Recurrent major depressive disorder, in partial remission ([place])  F33.41   3. Generalized anxiety disorder  F41.1     Receiving Psychotherapy: No    RECOMMENDATIONS:  PDMP reviewed.  I provided 20 minutes nonface to face time during this encounter, discussing his diagnosis and treatment.  I am glad to see him doing better with the insomnia. Continue trazodone 100 mg, 1-2 p.o. nightly as needed sleep. Continue Sonata 10 mg, 1 p.o. nightly as needed and may repeat 1 for mid nocturnal awakening as needed as long as he has 3 hours left to sleep. Cont Zoloft 100 mg 1 qd. Return in 3 months.  Donnal Moat, PA-C

## 2020-05-18 NOTE — Telephone Encounter (Signed)
Mr. kolbi, [place] are scheduled for a virtual visit with your provider today.    Just as we do with appointments in the office, we must obtain your consent to participate.  Your consent will be active for this visit and any virtual visit you may have with one of our providers in the next 365 days.    If you have a MyChart account, I can also send a copy of this consent to you electronically.  All virtual visits are billed to your insurance company just like a traditional visit in the office.  As this is a virtual visit, video technology does not allow for your provider to perform a traditional examination.  This may limit your provider's ability to fully assess your condition.  If your provider identifies any concerns that need to be evaluated in person or the need to arrange testing such as labs, EKG, etc, we will make arrangements to do so.    Although advances in technology are sophisticated, we cannot ensure that it will always work on either your end or our end.  If the connection with a video visit is poor, we may have to switch to a telephone visit.  With either a video or telephone visit, we are not always able to ensure that we have a secure connection.   I need to obtain your verbal consent now.   Are you willing to proceed with your visit today?   Welford Christmas Lofquist has provided verbal consent on 05/18/2020 for a virtual visit (video or telephone).   Donnal Moat, PA-C 05/18/2020  10:32 AM

## 2020-06-26 DIAGNOSIS — Z20822 Contact with and (suspected) exposure to covid-19: Secondary | ICD-10-CM | POA: Diagnosis not present

## 2020-06-26 DIAGNOSIS — R059 Cough, unspecified: Secondary | ICD-10-CM | POA: Diagnosis not present

## 2020-06-26 DIAGNOSIS — R0602 Shortness of breath: Secondary | ICD-10-CM | POA: Diagnosis not present

## 2020-06-27 DIAGNOSIS — R0602 Shortness of breath: Secondary | ICD-10-CM | POA: Diagnosis not present

## 2020-06-27 DIAGNOSIS — R059 Cough, unspecified: Secondary | ICD-10-CM | POA: Diagnosis not present

## 2020-06-28 ENCOUNTER — Encounter: Payer: Self-pay | Admitting: Adult Health

## 2020-06-28 ENCOUNTER — Ambulatory Visit: Payer: BC Managed Care – PPO | Admitting: Adult Health

## 2020-06-28 ENCOUNTER — Other Ambulatory Visit: Payer: Self-pay

## 2020-06-28 VITALS — BP 126/80 | Temp 98.6°F | Wt 319.0 lb

## 2020-06-28 DIAGNOSIS — R058 Other specified cough: Secondary | ICD-10-CM | POA: Diagnosis not present

## 2020-06-28 MED ORDER — ALBUTEROL SULFATE HFA 108 (90 BASE) MCG/ACT IN AERS: 2.0000 | INHALATION_SPRAY | Freq: Four times a day (QID) | RESPIRATORY_TRACT | 0 refills | Status: DC | PRN

## 2020-06-28 MED ORDER — MONTELUKAST SODIUM 10 MG PO TABS: 10.0000 mg | ORAL_TABLET | Freq: Every day | ORAL | 0 refills | Status: DC

## 2020-06-28 NOTE — Progress Notes (Signed)
Subjective:    Patient ID: Miguel Bean, male    DOB: 12-Jun-1960, 60 y.o.   MRN: [id number]  HPI 60 year old male who  has a past medical history of Back pain and PONV (postoperative nausea and vomiting).  He presents to the office today for a cough.  He reports that he has had a cough for the last 3 weeks.  Was seen at [facility] urgent care 2 days ago.  At this time he was swabbed for COVID-19 and had a chest x-ray done yesterday.  His COVID-19 swab came back negative and his chest x-ray was clear.  He was prescribed a prednisone taper which he is currently doing.  He does report some mild improvement in his symptoms since he started the prednisone taper, but as the doses go down his symptoms seem to be coming back.  Currently reports a dry cough that is pretty constant throughout the day, with some mild shortness of breath and wheezing at night.  Denies fevers, chills, sinusitis, difficulty breathing, or feeling acutely ill.   Review of Systems See HPI   Past Medical History:  Diagnosis Date  . Back pain   . PONV (postoperative nausea and vomiting)     Social History   Socioeconomic History  . Marital status: Married    Spouse name: Not on file  . Number of children: Not on file  . Years of education: Not on file  . Highest education level: Not on file  Occupational History  . Not on file  Tobacco Use  . Smoking status: Never Smoker  . Smokeless tobacco: Never Used  Vaping Use  . Vaping Use: Never used  Substance and Sexual Activity  . Alcohol use: No  . Drug use: No  . Sexual activity: Yes  Other Topics Concern  . Not on file  Social History Narrative  . Not on file   Social Determinants of Health   Financial Resource Strain: Not on file  Food Insecurity: Not on file  Transportation Needs: Not on file  Physical Activity: Not on file  Stress: Not on file  Social Connections: Not on file  Intimate Partner Violence: Not on file    Past Surgical History:   Procedure Laterality Date  . BACK SURGERY  08  . LUMBAR LAMINECTOMY  06/22/2011   Procedure: MICRODISCECTOMY LUMBAR LAMINECTOMY;  Surgeon: Marybelle Killings, MD;  Location: [facility];  Service: Orthopedics;  Laterality: Left;  Left L5-S1 Microdiscectomy  . VASECTOMY     96    History reviewed. No pertinent family history.  No Known Allergies  Current Outpatient Medications on File Prior to Visit  Medication Sig Dispense Refill  . sertraline (ZOLOFT) 100 MG tablet Take 1 tablet (100 mg total) by mouth daily. 90 tablet 1  . traZODone (DESYREL) 100 MG tablet Take 1-2 tablets (100-200 mg total) by mouth at bedtime as needed for sleep. 60 tablet 5  . zaleplon (SONATA) 10 MG capsule 1 po qhs prn, and may repeat 1 for midnocturnal awakening as long as there are 3 hours left to sleep. 30 capsule 5   No current facility-administered medications on file prior to visit.    BP 126/80   Temp 98.6 F (37 C)   Wt (!) 319 lb (144.7 kg)   BMI 43.26 kg/m       Objective:   Physical Exam Vitals and nursing note reviewed.  Constitutional:      Appearance: Normal appearance. He is obese.  Cardiovascular:     Rate and Rhythm: Normal rate and regular rhythm.     Pulses: Normal pulses.     Heart sounds: Normal heart sounds.  Pulmonary:     Effort: Pulmonary effort is normal.     Breath sounds: No stridor. Wheezing (course wheezing throughout lung fields) present. No rhonchi.  Neurological:     General: No focal deficit present.     Mental Status: He is alert and oriented to person, place, and time.  Psychiatric:        Mood and Affect: Mood normal.        Behavior: Behavior normal.        Thought Content: Thought content normal.        Judgment: Judgment normal.        Assessment & Plan:  1. Post-viral cough syndrome - albuterol (VENTOLIN HFA) 108 (90 Base) MCG/ACT inhaler; Inhale 2 puffs into the lungs every 6 (six) hours as needed for wheezing or shortness of breath.  Dispense: 8 g; Refill:  0 - montelukast (SINGULAIR) 10 MG tablet; Take 1 tablet (10 mg total) by mouth at bedtime.  Dispense: 14 tablet; Refill: 0  Dorothyann Peng, NP

## 2020-07-20 ENCOUNTER — Other Ambulatory Visit: Payer: Self-pay | Admitting: Adult Health

## 2020-07-20 DIAGNOSIS — R058 Other specified cough: Secondary | ICD-10-CM

## 2020-07-21 NOTE — Telephone Encounter (Signed)
PRESCRIBED WHILE PT WAS ILL.  SENT A MESSAGE TO THE PHARMACY INFORMING THEM THE PT SHOULD CONTACT THE OFFICE IF ADDITIONAL REFILLS ARE NEEDED.

## 2020-08-09 ENCOUNTER — Other Ambulatory Visit: Payer: Self-pay | Admitting: Physician Assistant

## 2020-09-06 ENCOUNTER — Other Ambulatory Visit: Payer: Self-pay | Admitting: Physician Assistant

## 2020-11-26 ENCOUNTER — Other Ambulatory Visit: Payer: Self-pay | Admitting: Physician Assistant

## 2020-11-30 NOTE — Telephone Encounter (Signed)
Pt scheduled for 8/12

## 2020-11-30 NOTE — Telephone Encounter (Signed)
Schedule f/u with Helene Kelp, due in March

## 2020-12-29 ENCOUNTER — Other Ambulatory Visit: Payer: Self-pay

## 2020-12-29 ENCOUNTER — Telehealth: Payer: Self-pay | Admitting: Physician Assistant

## 2020-12-29 MED ORDER — TRAZODONE HCL 100 MG PO TABS: 100.0000 mg | ORAL_TABLET | Freq: Every evening | ORAL | 1 refills | Status: DC | PRN

## 2020-12-29 NOTE — Telephone Encounter (Signed)
Rx sent 

## 2020-12-29 NOTE — Telephone Encounter (Signed)
Next visit is 01/06/21. Requesting refill on Trazodone called in to:  [id number] [place] [place] Phone:  [phone] Fax:  [phone]

## 2021-01-03 ENCOUNTER — Other Ambulatory Visit: Payer: Self-pay

## 2021-01-04 ENCOUNTER — Ambulatory Visit (INDEPENDENT_AMBULATORY_CARE_PROVIDER_SITE_OTHER): Payer: BC Managed Care – PPO | Admitting: Adult Health

## 2021-01-04 ENCOUNTER — Encounter: Payer: Self-pay | Admitting: Adult Health

## 2021-01-04 VITALS — BP 110/80 | HR 74 | Temp 98.5°F | Ht 70.0 in | Wt 322.0 lb

## 2021-01-04 DIAGNOSIS — F3289 Other specified depressive episodes: Secondary | ICD-10-CM | POA: Diagnosis not present

## 2021-01-04 DIAGNOSIS — M25562 Pain in left knee: Secondary | ICD-10-CM

## 2021-01-04 DIAGNOSIS — Z1159 Encounter for screening for other viral diseases: Secondary | ICD-10-CM

## 2021-01-04 DIAGNOSIS — F5101 Primary insomnia: Secondary | ICD-10-CM | POA: Diagnosis not present

## 2021-01-04 DIAGNOSIS — Z114 Encounter for screening for human immunodeficiency virus [HIV]: Secondary | ICD-10-CM

## 2021-01-04 DIAGNOSIS — G8929 Other chronic pain: Secondary | ICD-10-CM

## 2021-01-04 DIAGNOSIS — M25561 Pain in right knee: Secondary | ICD-10-CM

## 2021-01-04 DIAGNOSIS — Z125 Encounter for screening for malignant neoplasm of prostate: Secondary | ICD-10-CM | POA: Diagnosis not present

## 2021-01-04 DIAGNOSIS — Z23 Encounter for immunization: Secondary | ICD-10-CM

## 2021-01-04 DIAGNOSIS — Z1211 Encounter for screening for malignant neoplasm of colon: Secondary | ICD-10-CM

## 2021-01-04 DIAGNOSIS — Z0000 Encounter for general adult medical examination without abnormal findings: Secondary | ICD-10-CM

## 2021-01-04 DIAGNOSIS — G4700 Insomnia, unspecified: Secondary | ICD-10-CM | POA: Insufficient documentation

## 2021-01-04 LAB — CBC WITH DIFFERENTIAL/PLATELET
Basophils Absolute: 0.1 10*3/uL (ref 0.0–0.1)
Basophils Relative: 0.9 % (ref 0.0–3.0)
Eosinophils Absolute: 0.1 10*3/uL (ref 0.0–0.7)
Eosinophils Relative: 2.6 % (ref 0.0–5.0)
HCT: 46.9 % (ref 39.0–52.0)
Hemoglobin: 15.6 g/dL (ref 13.0–17.0)
Lymphocytes Relative: 30.2 % (ref 12.0–46.0)
Lymphs Abs: 1.6 10*3/uL (ref 0.7–4.0)
MCHC: 33.3 g/dL (ref 30.0–36.0)
MCV: 90.6 fl (ref 78.0–100.0)
Monocytes Absolute: 0.5 10*3/uL (ref 0.1–1.0)
Monocytes Relative: 9.1 % (ref 3.0–12.0)
Neutro Abs: 3.1 10*3/uL (ref 1.4–7.7)
Neutrophils Relative %: 57.2 % (ref 43.0–77.0)
Platelets: 194 10*3/uL (ref 150.0–400.0)
RBC: 5.17 Mil/uL (ref 4.22–5.81)
RDW: 13.1 % (ref 11.5–15.5)
WBC: 5.4 10*3/uL (ref 4.0–10.5)

## 2021-01-04 LAB — COMPREHENSIVE METABOLIC PANEL
ALT: 23 U/L (ref 0–53)
AST: 22 U/L (ref 0–37)
Albumin: 3.9 g/dL (ref 3.5–5.2)
Alkaline Phosphatase: 77 U/L (ref 39–117)
BUN: 14 mg/dL (ref 6–23)
CO2: 27 mEq/L (ref 19–32)
Calcium: 9.1 mg/dL (ref 8.4–10.5)
Chloride: 104 mEq/L (ref 96–112)
Creatinine, Ser: 1.13 mg/dL (ref 0.40–1.50)
GFR: 70.73 mL/min (ref >60.00)
Glucose, Bld: 89 mg/dL (ref 70–99)
Potassium: 4.5 mEq/L (ref 3.5–5.1)
Sodium: 140 mEq/L (ref 135–145)
Total Bilirubin: 1 mg/dL (ref 0.2–1.2)
Total Protein: 6.4 g/dL (ref 6.0–8.3)

## 2021-01-04 LAB — LIPID PANEL
Cholesterol: 167 mg/dL (ref 0–200)
HDL: 43.5 mg/dL (ref >39.00)
LDL Cholesterol: 93 mg/dL (ref 0–99)
NonHDL: 123.98
Total CHOL/HDL Ratio: 4
Triglycerides: 153 mg/dL — ABNORMAL HIGH (ref 0.0–149.0)
VLDL: 30.6 mg/dL (ref 0.0–40.0)

## 2021-01-04 LAB — PSA: PSA: 0.76 ng/mL (ref 0.10–4.00)

## 2021-01-04 LAB — HEMOGLOBIN A1C: Hgb A1c MFr Bld: 5.4 % (ref 4.6–6.5)

## 2021-01-04 LAB — TSH: TSH: 12.17 u[IU]/mL — ABNORMAL HIGH (ref 0.35–5.50)

## 2021-01-04 NOTE — Patient Instructions (Addendum)
It was great seeing you today   We will follow up with you regarding your blood work   Please make an appointment in 2-4 months for your second shingles vaccination   Work on weight loss through diet and exercise   Someone will call you to schedule your colonoscopy

## 2021-01-04 NOTE — Progress Notes (Signed)
Subjective:    Patient ID: Miguel Bean, male    DOB: 1960/12/10, 60 y.o.   MRN: [id number]  HPI Patient presents for yearly preventative medicine examination. He is a pleasant 60 year old male who  has a past medical history of Anxiety and depression, Back pain, Insomnia, and PONV (postoperative nausea and vomiting).  Insomnia -managed by behavioral health.  Currently prescribed trazodone 100-200 mg nightly and Sonata 10 mg  as needed.  He is able to go to sleep onset of time but sometimes he will wake up in the night and has to take a Sonata then.  Depression/Anxiety-currently prescribed Zoloft 100.  Managed by behavioral health. Feels as though he is well controlled.   Chronic bilateral Knee pain - from osteoarthritis - has gone through gel and steroid injections. Has pretty significant tricompartmental osteoarthritis.   All immunizations and health maintenance protocols were reviewed with the patient and needed orders were placed.  Appropriate screening laboratory values were ordered for the patient including screening of hyperlipidemia, renal function and hepatic function. If indicated by BPH, a PSA was ordered.  Medication reconciliation,  past medical history, social history, problem list and allergies were reviewed in detail with the patient  Goals were established with regard to weight loss, exercise, and  diet in compliance with medications. Exercise is hard for him due to chronic knee pain. Does not follow a heart healthy diet.  Wt Readings from Last 3 Encounters:  01/04/21 (!) 322 lb (146.1 kg)  06/28/20 (!) 319 lb (144.7 kg)  03/18/19 (!) 317 lb (143.8 kg)   He is due for colon cancer screening - last was 2017 - he is on the 5 year plan.    Review of Systems  Constitutional: Negative.   HENT: Negative.    Eyes: Negative.   Respiratory: Negative.    Cardiovascular: Negative.   Gastrointestinal: Negative.   Endocrine: Negative.   Genitourinary: Negative.    Musculoskeletal:  Positive for arthralgias (bilateral knees).  Skin: Negative.   Allergic/Immunologic: Negative.   Neurological: Negative.   Hematological: Negative.   Psychiatric/Behavioral: Negative.    All other systems reviewed and are negative.  Past Medical History:  Diagnosis Date   Anxiety and depression    Back pain    Insomnia    PONV (postoperative nausea and vomiting)     Social History   Socioeconomic History   Marital status: Married    Spouse name: Not on file   Number of children: Not on file   Years of education: Not on file   Highest education level: Not on file  Occupational History   Not on file  Tobacco Use   Smoking status: Never   Smokeless tobacco: Never  Vaping Use   Vaping Use: Never used  Substance and Sexual Activity   Alcohol use: No   Drug use: No   Sexual activity: Yes  Other Topics Concern   Not on file  Social History Narrative   Not on file   Social Determinants of Health   Financial Resource Strain: Not on file  Food Insecurity: Not on file  Transportation Needs: Not on file  Physical Activity: Not on file  Stress: Not on file  Social Connections: Not on file  Intimate Partner Violence: Not on file    Past Surgical History:  Procedure Laterality Date   BACK SURGERY  08   LUMBAR LAMINECTOMY  06/22/2011   Procedure: MICRODISCECTOMY LUMBAR LAMINECTOMY;  Surgeon: Marybelle Killings, MD;  Location: [facility];  Service: Orthopedics;  Laterality: Left;  Left L5-S1 Microdiscectomy   VASECTOMY     96    History reviewed. No pertinent family history.  No Known Allergies  Current Outpatient Medications on File Prior to Visit  Medication Sig Dispense Refill   sertraline (ZOLOFT) 100 MG tablet TAKE 1 TABLET BY MOUTH EVERY DAY 90 tablet 0   traZODone (DESYREL) 100 MG tablet Take 1-2 tablets (100-200 mg total) by mouth at bedtime as needed for sleep. 60 tablet 1   zaleplon (SONATA) 10 MG capsule TAKE 1 CAPSULE BY MOUTH AT BEDTIME AS  NEEDED , AND MAY REPEAT 1 FOR MIDNOCTURNAL AWAKENING AS LONG AS THERE ARE 3 HOURS LEFT TO SLEEP. 30 capsule 0   No current facility-administered medications on file prior to visit.    BP 110/80   Pulse 74   Temp 98.5 F (36.9 C) (Oral)   Ht '5\' 10"'$  (1.778 m)   Wt (!) 322 lb (146.1 kg)   SpO2 97%   BMI 46.20 kg/m        Objective:   Physical Exam Vitals and nursing note reviewed.  Constitutional:      General: He is not in acute distress.    Appearance: Normal appearance. He is well-developed. He is obese.  HENT:     Head: Normocephalic and atraumatic.     Right Ear: Tympanic membrane, ear canal and external ear normal. There is no impacted cerumen.     Left Ear: Tympanic membrane, ear canal and external ear normal. There is no impacted cerumen.     Nose: Nose normal. No congestion or rhinorrhea.     Mouth/Throat:     Mouth: Mucous membranes are moist.     Pharynx: Oropharynx is clear. No oropharyngeal exudate or posterior oropharyngeal erythema.  Eyes:     General:        Right eye: No discharge.        Left eye: No discharge.     Extraocular Movements: Extraocular movements intact.     Conjunctiva/sclera: Conjunctivae normal.     Pupils: Pupils are equal, round, and reactive to light.  Neck:     Vascular: No carotid bruit.     Trachea: No tracheal deviation.  Cardiovascular:     Rate and Rhythm: Normal rate and regular rhythm.     Pulses: Normal pulses.     Heart sounds: Normal heart sounds. No murmur heard.   No friction rub. No gallop.  Pulmonary:     Effort: Pulmonary effort is normal. No respiratory distress.     Breath sounds: Normal breath sounds. No stridor. No wheezing, rhonchi or rales.  Chest:     Chest wall: No tenderness.  Abdominal:     General: Bowel sounds are normal. There is no distension.     Palpations: Abdomen is soft. There is no mass.     Tenderness: There is no abdominal tenderness. There is no right CVA tenderness, left CVA tenderness,  guarding or rebound.     Hernia: No hernia is present.  Musculoskeletal:        General: No swelling, tenderness, deformity or signs of injury. Normal range of motion.     Right lower leg: No edema.     Left lower leg: No edema.  Lymphadenopathy:     Cervical: No cervical adenopathy.  Skin:    General: Skin is warm and dry.     Capillary Refill: Capillary refill takes less than 2 seconds.  Coloration: Skin is not jaundiced or pale.     Findings: No bruising, erythema, lesion or rash.  Neurological:     General: No focal deficit present.     Mental Status: He is alert and oriented to person, place, and time.     Cranial Nerves: No cranial nerve deficit.     Sensory: No sensory deficit.     Motor: No weakness.     Coordination: Coordination normal.     Gait: Gait normal.     Deep Tendon Reflexes: Reflexes normal.  Psychiatric:        Mood and Affect: Mood normal.        Behavior: Behavior normal.        Thought Content: Thought content normal.        Judgment: Judgment normal.      Assessment & Plan:  1. Routine general medical examination at a health care facility - Encouraged weight loss through diet and exercise - Follow up in one year or sooner if needed - CBC with Differential/Platelet; Future - Comprehensive metabolic panel; Future - Hemoglobin A1c; Future - Lipid panel; Future - TSH; Future - TSH - Lipid panel - Hemoglobin A1c - Comprehensive metabolic panel - CBC with Differential/Platelet  2. Primary insomnia - Controlled. Continue to follow up with [facility]   3. Other depression - Controlled. Continue to follow up with [facility]   4. Prostate cancer screening  - PSA; Future - PSA  5. Need for hepatitis C screening test  - Hep C Antibody; Future - Hep C Antibody  6. Encounter for screening for HIV  - HIV Antibody (routine testing w rflx); Future - HIV Antibody (routine testing w rflx)  7. Colon cancer screening  - Ambulatory referral to  Gastroenterology  8. Chronic pain of both knees - Likely needs knee replacement. Does not want to have this done yet   9. Need for shingles vaccine  - Varicella-zoster vaccine IM  Dorothyann Peng, NP

## 2021-01-05 LAB — HEPATITIS C ANTIBODY
Hepatitis C Ab: NONREACTIVE
SIGNAL TO CUT-OFF: 0.01 (ref <1.00)

## 2021-01-05 LAB — HIV ANTIBODY (ROUTINE TESTING W REFLEX): HIV 1&2 Ab, 4th Generation: NONREACTIVE

## 2021-01-05 MED ORDER — LEVOTHYROXINE SODIUM 112 MCG PO TABS: 112.0000 ug | ORAL_TABLET | Freq: Every day | ORAL | 0 refills | Status: DC

## 2021-01-05 NOTE — Addendum Note (Signed)
Addended by: Gwenyth Ober R on: 01/05/2021 02:22 PM   Modules accepted: Orders

## 2021-01-06 ENCOUNTER — Other Ambulatory Visit: Payer: Self-pay

## 2021-01-06 ENCOUNTER — Encounter: Payer: Self-pay | Admitting: Physician Assistant

## 2021-01-06 ENCOUNTER — Ambulatory Visit (INDEPENDENT_AMBULATORY_CARE_PROVIDER_SITE_OTHER): Payer: BC Managed Care – PPO | Admitting: Physician Assistant

## 2021-01-06 DIAGNOSIS — F3341 Major depressive disorder, recurrent, in partial remission: Secondary | ICD-10-CM

## 2021-01-06 DIAGNOSIS — G4700 Insomnia, unspecified: Secondary | ICD-10-CM | POA: Diagnosis not present

## 2021-01-06 DIAGNOSIS — F411 Generalized anxiety disorder: Secondary | ICD-10-CM

## 2021-01-06 MED ORDER — ZALEPLON 10 MG PO CAPS: ORAL_CAPSULE | ORAL | 5 refills | Status: DC

## 2021-01-06 MED ORDER — SERTRALINE HCL 100 MG PO TABS: 100.0000 mg | ORAL_TABLET | Freq: Every day | ORAL | 1 refills | Status: DC

## 2021-01-06 NOTE — Progress Notes (Signed)
[facility]  Patient ID: Miguel Bean,  MRN: [id number]  PCP: Dorothyann Peng, NP  Date of Evaluation: 01/06/2021 Time spent:20 minutes  Chief Complaint:  Chief Complaint   Depression; Insomnia; Follow-up     HISTORY/CURRENT STATUS: HPI  6 months overdue for appt.  Patient denies loss of interest in usual activities and is able to enjoy things.  Work is going well.  Turned 60 this year and kids are giving him and his wife a cruise. Looking forward to it. Denies decreased energy or motivation.  Appetite has not changed.  No extreme sadness, tearfulness, or feelings of hopelessness.  Denies any changes in concentration, making decisions or remembering things.  Denies suicidal or homicidal thoughts.  He ran out of Sonata b/c hadn't made an appt. Trazodone, as well, but we sent that in.  He could tell huge difference when he was not on the trazodone and is grateful to have it back.  He still needs the [facility] because he cannot fall asleep sometimes even with the trazodone, depending on what type day he has had.  But even when he had some on hand he was not using it every night.  Patient denies increased energy with decreased need for sleep, no increased talkativeness, no racing thoughts, no impulsivity or risky behaviors, no increased spending, no increased libido, no grandiosity, no increased irritability or anger, and no hallucinations.  Denies dizziness, syncope, seizures, numbness, tingling, tremor, tics, unsteady gait, slurred speech, confusion. Denies muscle or joint pain, stiffness, or dystonia.  Individual Medical History/ Review of Systems: Changes? :Yes   just started Synthroid meds this morning.    Past medications for mental health diagnoses include: Zoloft, Melatonin, Trazodone  Allergies: Patient has no known allergies.  Current Medications:  Current Outpatient Medications:    levothyroxine (SYNTHROID) 112 MCG tablet, Take 1 tablet (112 mcg total) by mouth  daily before breakfast., Disp: 90 tablet, Rfl: 0   traZODone (DESYREL) 100 MG tablet, Take 1-2 tablets (100-200 mg total) by mouth at bedtime as needed for sleep., Disp: 60 tablet, Rfl: 1   sertraline (ZOLOFT) 100 MG tablet, Take 1 tablet (100 mg total) by mouth daily., Disp: 90 tablet, Rfl: 1   zaleplon (SONATA) 10 MG capsule, TAKE 1 CAPSULE BY MOUTH AT BEDTIME AS NEEDED , AND MAY REPEAT 1 FOR MIDNOCTURNAL AWAKENING AS LONG AS THERE ARE 3 HOURS LEFT TO SLEEP., Disp: 30 capsule, Rfl: 5 Medication Side Effects: none  Family Medical/ Social History: Changes?  No  MENTAL HEALTH EXAM:  There were no vitals taken for this visit.There is no height or weight on file to calculate BMI.  General Appearance: Casual, Neat, Well Groomed and Obese  Eye Contact:  Good  Speech:  Clear and Coherent and Normal Rate  Volume:  Normal  Mood:  Euthymic  Affect:  Appropriate  Thought Process:  Goal Directed and Descriptions of Associations: Intact  Orientation:  Full (Time, Place, and Person)  Thought Content: Logical   Suicidal Thoughts:  No  Homicidal Thoughts:  No  Memory:  WNL  Judgement:  Good  Insight:  Good  Psychomotor Activity:  Normal  Concentration:  Concentration: Good and Attention Span: Good  Recall:  Good  Fund of Knowledge: Good  Language: Good  Assets:  Desire for Improvement  ADL's:  Intact  Cognition: WNL  Prognosis:  Good    DIAGNOSES:    ICD-10-CM   1. Generalized anxiety disorder  F41.1     2. Insomnia, unspecified type  G47.00     3. Recurrent major depressive disorder, in partial remission ([facility])  F33.41       Receiving Psychotherapy: No    RECOMMENDATIONS:  PDMP reviewed.  Last Sonata was filled 08/09/2020. I provided 20 minutes of face to face time during this encounter, including time spent before and after the visit in records review, medical decision making, and charting.  Doing well.  No changes. Discussed keeping appts.  Continue trazodone 100 mg, 1-2 p.o.  nightly as needed sleep. Continue Sonata 10 mg, 1 p.o. nightly as needed and may repeat 1 for mid nocturnal awakening as needed as long as he has 3 hours left to sleep. Cont Zoloft 100 mg 1 qd. Return in 6 months.  Donnal Moat, PA-C

## 2021-01-20 ENCOUNTER — Other Ambulatory Visit: Payer: Self-pay | Admitting: Physician Assistant

## 2021-02-04 ENCOUNTER — Other Ambulatory Visit: Payer: Self-pay | Admitting: Physician Assistant

## 2021-02-06 NOTE — Telephone Encounter (Signed)
Last filled 8.12.22

## 2021-02-07 ENCOUNTER — Encounter: Payer: Self-pay | Admitting: Gastroenterology

## 2021-03-14 ENCOUNTER — Ambulatory Visit (AMBULATORY_SURGERY_CENTER): Payer: Self-pay

## 2021-03-14 ENCOUNTER — Encounter: Payer: Self-pay | Admitting: Gastroenterology

## 2021-03-14 ENCOUNTER — Other Ambulatory Visit: Payer: Self-pay

## 2021-03-14 VITALS — Ht 70.0 in | Wt 324.0 lb

## 2021-03-14 DIAGNOSIS — Z86010 Personal history of colonic polyps: Secondary | ICD-10-CM

## 2021-03-14 DIAGNOSIS — Z1211 Encounter for screening for malignant neoplasm of colon: Secondary | ICD-10-CM

## 2021-03-14 MED ORDER — PEG-KCL-NACL-NASULF-NA ASC-C 100 G PO SOLR: 1.0000 | Freq: Once | ORAL | 0 refills | Status: AC

## 2021-03-14 NOTE — Progress Notes (Signed)
No allergies to soy or egg Pt is not on blood thinners or diet pills Denies issues with sedation/intubation Denies atrial flutter/fib Denies constipation   Emmi instructions given to pt  Pt is aware of Covid safety and care partner requirements.   Pt states he had a 2nd colonoscopy with polyps at some point.

## 2021-03-24 ENCOUNTER — Other Ambulatory Visit: Payer: Self-pay

## 2021-03-27 ENCOUNTER — Other Ambulatory Visit: Payer: Self-pay

## 2021-03-27 ENCOUNTER — Other Ambulatory Visit: Payer: BC Managed Care – PPO

## 2021-03-27 ENCOUNTER — Telehealth: Payer: Self-pay

## 2021-03-27 NOTE — Telephone Encounter (Signed)
Pt has appt scheduled this am to have labs drawn, however, no labs were ordered. Upon review of the chart, result note from PCP on 01/05/21: "We will need to repeat this in 4 weeks to make sure the dose is correct."  Lab added per verbal order of PCP & patient contacted to reschedule.

## 2021-03-30 ENCOUNTER — Ambulatory Visit (AMBULATORY_SURGERY_CENTER): Payer: BC Managed Care – PPO | Admitting: Gastroenterology

## 2021-03-30 ENCOUNTER — Other Ambulatory Visit: Payer: Self-pay

## 2021-03-30 ENCOUNTER — Encounter: Payer: Self-pay | Admitting: Gastroenterology

## 2021-03-30 VITALS — BP 97/61 | HR 68 | Temp 97.8°F | Resp 14 | Ht 70.0 in | Wt 324.0 lb

## 2021-03-30 DIAGNOSIS — D123 Benign neoplasm of transverse colon: Secondary | ICD-10-CM

## 2021-03-30 DIAGNOSIS — Z86010 Personal history of colonic polyps: Secondary | ICD-10-CM

## 2021-03-30 DIAGNOSIS — D120 Benign neoplasm of cecum: Secondary | ICD-10-CM

## 2021-03-30 DIAGNOSIS — Z1211 Encounter for screening for malignant neoplasm of colon: Secondary | ICD-10-CM | POA: Diagnosis not present

## 2021-03-30 MED ORDER — SODIUM CHLORIDE 0.9 % IV SOLN: 500.0000 mL | Freq: Once | INTRAVENOUS | Status: DC

## 2021-03-30 NOTE — Progress Notes (Signed)
Pt's states no medical or surgical changes since previsit or office visit. VS assessed by D.T 

## 2021-03-30 NOTE — Progress Notes (Signed)
Called to room to assist during endoscopic procedure.  Patient ID and intended procedure confirmed with present staff. Received instructions for my participation in the procedure from the performing physician.  

## 2021-03-30 NOTE — Progress Notes (Signed)
Report given to PACU, vss 

## 2021-03-30 NOTE — Op Note (Signed)
[facility] Patient Name: Miguel Bean Procedure Date: 03/30/2021 9:24 AM MRN: [id number] Endoscopist: [place]. Loletha Carrow , MD Age: 60 Referring MD:  Date of Birth: 1960-06-08 Gender: Male Account #: [id number] Procedure:                Colonoscopy Indications:              Surveillance: Personal history of colonic polyps                            (unknown histology) on last colonoscopy more than 5                            years ago                           <30mm TA Feb 2009 at this practice. Patient reports                            receiving recall for polyps on colonoscopy at                            outside clinic (unknown pathology) about 7 years ago Medicines:                Monitored Anesthesia Care Procedure:                Pre-Anesthesia Assessment:                           - Prior to the procedure, a History and Physical                            was performed, and patient medications and                            allergies were reviewed. The patient's tolerance of                            previous anesthesia was also reviewed. The risks                            and benefits of the procedure and the sedation                            options and risks were discussed with the patient.                            All questions were answered, and informed consent                            was obtained. Prior Anticoagulants: The patient has                            taken no previous anticoagulant or antiplatelet  agents. ASA Grade Assessment: III - A patient with                            severe systemic disease. After reviewing the risks                            and benefits, the patient was deemed in                            satisfactory condition to undergo the procedure.                           After obtaining informed consent, the colonoscope                            was passed under direct vision. Throughout the                             procedure, the patient's blood pressure, pulse, and                            oxygen saturations were monitored continuously. The                            CF [id number] #[id number] was introduced through the anus                            and advanced to the the cecum, identified by                            appendiceal orifice and ileocecal valve. The                            colonoscopy was performed without difficulty. The                            patient tolerated the procedure well. The quality                            of the bowel preparation was excellent. The                            ileocecal valve, appendiceal orifice, and rectum                            were photographed. Scope In: 9:35:17 AM Scope Out: 9:50:04 AM Scope Withdrawal Time: 0 hours 11 minutes 58 seconds  Total Procedure Duration: 0 hours 14 minutes 47 seconds  Findings:                 The perianal and digital rectal examinations were                            normal.  Two sessile and semi-sessile polyps were found in                            the transverse colon and cecum. The polyps were 2                            to 6 mm in size. These polyps were removed with a                            cold snare. Resection and retrieval were complete.                           The exam was otherwise without abnormality on                            direct and retroflexion views. Complications:            No immediate complications. Estimated Blood Loss:     Estimated blood loss was minimal. Impression:               - Two 2 to 6 mm polyps in the transverse colon and                            in the cecum, removed with a cold snare. Resected                            and retrieved.                           - The examination was otherwise normal on direct                            and retroflexion views. Recommendation:           - Patient has a contact number  available for                            emergencies. The signs and symptoms of potential                            delayed complications were discussed with the                            patient. Return to normal activities tomorrow.                            Written discharge instructions were provided to the                            patient.                           - Resume previous diet.                           -  Continue present medications.                           - Await pathology results.                           - Repeat colonoscopy is recommended for                            surveillance. The colonoscopy date will be                            determined after pathology results from today's                            exam become available for review. Rontrell Moquin L. Loletha Carrow, MD 03/30/2021 9:53:55 AM This report has been signed electronically.

## 2021-03-30 NOTE — Patient Instructions (Signed)
Handout on polyps given. ° °YOU HAD AN ENDOSCOPIC PROCEDURE TODAY AT THE [facility]:   Refer to the procedure report that was given to you for any specific questions about what was found during the examination.  If the procedure report does not answer your questions, please call your gastroenterologist to clarify.  If you requested that your care partner not be given the details of your procedure findings, then the procedure report has been included in a sealed envelope for you to review at your convenience later. ° °YOU SHOULD EXPECT: Some feelings of bloating in the abdomen. Passage of more gas than usual.  Walking can help get rid of the air that was put into your GI tract during the procedure and reduce the bloating. If you had a lower endoscopy (such as a colonoscopy or flexible sigmoidoscopy) you may notice spotting of blood in your stool or on the toilet paper. If you underwent a bowel prep for your procedure, you may not have a normal bowel movement for a few days. ° °Please Note:  You might notice some irritation and congestion in your nose or some drainage.  This is from the oxygen used during your procedure.  There is no need for concern and it should clear up in a day or so. ° °SYMPTOMS TO REPORT IMMEDIATELY: ° °Following lower endoscopy (colonoscopy or flexible sigmoidoscopy): ° Excessive amounts of blood in the stool ° Significant tenderness or worsening of abdominal pains ° Swelling of the abdomen that is new, acute ° Fever of 100°F or higher ° °For urgent or emergent issues, a gastroenterologist can be reached at any hour by calling [phone]. °Do not use MyChart messaging for urgent concerns.  ° ° °DIET:  We do recommend a small meal at first, but then you may proceed to your regular diet.  Drink plenty of fluids but you should avoid alcoholic beverages for 24 hours. ° °ACTIVITY:  You should plan to take it easy for the rest of today and you should NOT DRIVE or use heavy machinery  until tomorrow (because of the sedation medicines used during the test).   ° °FOLLOW UP: °Our staff will call the number listed on your records 48-72 hours following your procedure to check on you and address any questions or concerns that you may have regarding the information given to you following your procedure. If we do not reach you, we will leave a message.  We will attempt to reach you two times.  During this call, we will ask if you have developed any symptoms of COVID 19. If you develop any symptoms (ie: fever, flu-like symptoms, shortness of breath, cough etc.) before then, please call [phone].  If you test positive for Covid 19 in the 2 weeks post procedure, please call and report this information to us.   ° °If any biopsies were taken you will be contacted by phone or by letter within the next 1-3 weeks.  Please call us at [phone] if you have not heard about the biopsies in 3 weeks.  ° ° °SIGNATURES/CONFIDENTIALITY: °You and/or your care partner have signed paperwork which will be entered into your electronic medical record.  These signatures attest to the fact that that the information above on your After Visit Summary has been reviewed and is understood.  Full responsibility of the confidentiality of this discharge information lies with you and/or your care-partner.  °

## 2021-03-30 NOTE — Progress Notes (Signed)
History and Physical:  This patient presents for endoscopic testing for: Encounter Diagnosis  Name Primary?   Personal history of colonic polyps Yes    TA < 43mm 06/2007 with [facility] Patient reports polyps about 7 yrs ago in [place] (got recall letter) Patient denies chronic abdominal pain, rectal bleeding, constipation or diarrhea.   ROS: Patient denies chest pain or cough   Past Medical History: Past Medical History:  Diagnosis Date   Anxiety and depression    Back pain    Insomnia    PONV (postoperative nausea and vomiting)    Thyroid disease      Past Surgical History: Past Surgical History:  Procedure Laterality Date   BACK SURGERY  05/28/2006   COLONOSCOPY     LUMBAR LAMINECTOMY  06/22/2011   Procedure: MICRODISCECTOMY LUMBAR LAMINECTOMY;  Surgeon: Marybelle Killings, MD;  Location: [facility];  Service: Orthopedics;  Laterality: Left;  Left L5-S1 Microdiscectomy   VASECTOMY     96    Allergies: No Known Allergies  Outpatient Meds: Current Outpatient Medications  Medication Sig Dispense Refill   levothyroxine (SYNTHROID) 112 MCG tablet Take 1 tablet (112 mcg total) by mouth daily before breakfast. 90 tablet 0   sertraline (ZOLOFT) 100 MG tablet Take 1 tablet (100 mg total) by mouth daily. 90 tablet 1   traZODone (DESYREL) 100 MG tablet TAKE 1-2 TABLETS (100-200 MG TOTAL) BY MOUTH AT BEDTIME AS NEEDED FOR SLEEP. 180 tablet 1   zaleplon (SONATA) 10 MG capsule TAKE 1 CAPSULE BY MOUTH AT BEDTIME AS NEEDED , AND MAY REPEAT 1 FOR MIDNOCTURNAL AWAKENING AS LONG AS THERE ARE 3 HOURS LEFT TO SLEEP. 30 capsule 5   Current Facility-Administered Medications  Medication Dose Route Frequency Provider Last Rate Last Admin   0.9 %  sodium chloride infusion  500 mL Intravenous Once Danis, Estill Cotta III, MD          ___________________________________________________________________ Objective   Exam:  BP 119/62   Pulse 65   Temp 97.8 F (36.6 C) (Skin)   Ht 5\' 10"  (1.778 m)   Wt  (!) 324 lb (147 kg)   SpO2 96%   BMI 46.49 kg/m   CV: RRR without murmur, S1/S2 Resp: clear to auscultation bilaterally, normal RR and effort noted GI: soft, no tenderness, with active bowel sounds.(Limited by body habitus - morbidly obese)   Assessment: Encounter Diagnosis  Name Primary?   Personal history of colonic polyps Yes     Plan: Colonoscopy  The benefits and risks of the planned procedure were described in detail with the patient or (when appropriate) their health care proxy.  Risks were outlined as including, but not limited to, bleeding, infection, perforation, adverse medication reaction leading to cardiac or pulmonary decompensation, pancreatitis (if ERCP).  The limitation of incomplete mucosal visualization was also discussed.  No guarantees or warranties were given.    The patient is appropriate for an endoscopic procedure in the ambulatory setting.   - Wilfrid Lund, MD

## 2021-04-03 ENCOUNTER — Telehealth: Payer: Self-pay | Admitting: *Deleted

## 2021-04-03 NOTE — Telephone Encounter (Signed)
Attempted to call patient for their post-procedure follow-up call. No answer. Left voicemail.   

## 2021-04-03 NOTE — Telephone Encounter (Signed)
  Follow up Call-  Call back number 03/30/2021 03/30/2021  Post procedure Call Back phone  # [phone]  Permission to leave phone message Yes Yes  Some recent data might be hidden     Patient questions:  Do you have a fever, pain , or abdominal swelling? No. Pain Score  0 *  Have you tolerated food without any problems? Yes.    Have you been able to return to your normal activities? Yes.    Do you have any questions about your discharge instructions: Diet   No. Medications  No. Follow up visit  No.  Do you have questions or concerns about your Care? No.  Actions: * If pain score is 4 or above: No action needed, pain <4.  Have you developed a fever since your procedure? no  2.   Have you had an respiratory symptoms (SOB or cough) since your procedure? no  3.   Have you tested positive for COVID 19 since your procedure no  4.   Have you had any family members/close contacts diagnosed with the COVID 19 since your procedure?  no   If yes to any of these questions please route to Joylene John, RN and Joella Prince, RN

## 2021-04-04 ENCOUNTER — Encounter: Payer: Self-pay | Admitting: Gastroenterology

## 2021-04-05 ENCOUNTER — Other Ambulatory Visit (INDEPENDENT_AMBULATORY_CARE_PROVIDER_SITE_OTHER): Payer: BC Managed Care – PPO

## 2021-04-05 ENCOUNTER — Other Ambulatory Visit: Payer: Self-pay | Admitting: Adult Health

## 2021-04-05 ENCOUNTER — Other Ambulatory Visit: Payer: Self-pay

## 2021-04-05 ENCOUNTER — Other Ambulatory Visit: Payer: Self-pay | Admitting: Physician Assistant

## 2021-04-05 DIAGNOSIS — E039 Hypothyroidism, unspecified: Secondary | ICD-10-CM | POA: Diagnosis not present

## 2021-04-05 LAB — TSH: TSH: 2.86 u[IU]/mL (ref 0.35–5.50)

## 2021-04-05 MED ORDER — LEVOTHYROXINE SODIUM 112 MCG PO TABS: 112.0000 ug | ORAL_TABLET | Freq: Every day | ORAL | 2 refills | Status: DC

## 2021-04-12 ENCOUNTER — Encounter: Payer: Self-pay | Admitting: Adult Health

## 2021-04-12 ENCOUNTER — Telehealth: Payer: BC Managed Care – PPO | Admitting: Adult Health

## 2021-04-12 VITALS — Ht 70.0 in | Wt 324.0 lb

## 2021-04-12 DIAGNOSIS — M5441 Lumbago with sciatica, right side: Secondary | ICD-10-CM | POA: Diagnosis not present

## 2021-04-12 MED ORDER — METHYLPREDNISOLONE 4 MG PO TBPK: ORAL_TABLET | ORAL | 0 refills | Status: DC

## 2021-04-12 NOTE — Progress Notes (Signed)
Virtual Visit via Video Note  I connected with Miguel Bean on 04/12/21 at 11:00 AM EST by a video enabled telemedicine application and verified that I am speaking with the correct person using two identifiers.  Location patient: home Location provider:work or home office Persons participating in the virtual visit: patient, provider  I discussed the limitations of evaluation and management by telemedicine and the availability of in person appointments. The patient expressed understanding and agreed to proceed.   HPI: 60 year old male who  has a past medical history of Anxiety and depression, Back pain, Insomnia, PONV (postoperative nausea and vomiting), and Thyroid disease.  History of lumbar radiculopathy last treated by sports medicine in 2019.  Over the last week he is experienced same symptoms with radiating pain down the outside of his right leg.  Pain is worse with changing positions and ambulation.  He has been taking Motrin over the last week without improvement.  Has done well with prednisone in the past.   ROS: See pertinent positives and negatives per HPI.  Past Medical History:  Diagnosis Date   Anxiety and depression    Back pain    Insomnia    PONV (postoperative nausea and vomiting)    Thyroid disease     Past Surgical History:  Procedure Laterality Date   BACK SURGERY  05/28/2006   COLONOSCOPY     LUMBAR LAMINECTOMY  06/22/2011   Procedure: MICRODISCECTOMY LUMBAR LAMINECTOMY;  Surgeon: Marybelle Killings, MD;  Location: [facility];  Service: Orthopedics;  Laterality: Left;  Left L5-S1 Microdiscectomy   VASECTOMY     61    Family History  Problem Relation Age of Onset   Colon polyps Father    Colon cancer Neg Hx    Esophageal cancer Neg Hx    Rectal cancer Neg Hx    Stomach cancer Neg Hx        Current Outpatient Medications:    levothyroxine (SYNTHROID) 112 MCG tablet, Take 1 tablet (112 mcg total) by mouth daily before breakfast., Disp: 90 tablet, Rfl: 2    sertraline (ZOLOFT) 100 MG tablet, Take 1 tablet (100 mg total) by mouth daily., Disp: 90 tablet, Rfl: 1   traZODone (DESYREL) 100 MG tablet, TAKE 1-2 TABLETS (100-200 MG TOTAL) BY MOUTH AT BEDTIME AS NEEDED FOR SLEEP., Disp: 180 tablet, Rfl: 1   zaleplon (SONATA) 10 MG capsule, TAKE 1 CAPSULE BY MOUTH AT BEDTIME AS NEEDED , AND MAY REPEAT 1 FOR MIDNOCTURNAL AWAKENING AS LONG AS THERE ARE 3 HOURS LEFT TO SLEEP., Disp: 30 capsule, Rfl: 5  EXAM:  VITALS per patient if applicable:  GENERAL: alert, oriented, appears well and in no acute distress  HEENT: atraumatic, conjunttiva clear, no obvious abnormalities on inspection of external nose and ears  NECK: normal movements of the head and neck  LUNGS: on inspection no signs of respiratory distress, breathing rate appears normal, no obvious gross SOB, gasping or wheezing  CV: no obvious cyanosis  MS: moves all visible extremities without noticeable abnormality  PSYCH/NEURO: pleasant and cooperative, no obvious depression or anxiety, speech and thought processing grossly intact  ASSESSMENT AND PLAN:  Discussed the following assessment and plan:  1. Acute right-sided low back pain with right-sided sciatica  - methylPREDNISolone (MEDROL DOSEPAK) 4 MG TBPK tablet; Take as directed  Dispense: 21 tablet; Refill: 0 - Follow up if not resolved in the next week     I discussed the assessment and treatment plan with the patient. The patient was  provided an opportunity to ask questions and all were answered. The patient agreed with the plan and demonstrated an understanding of the instructions.   The patient was advised to call back or seek an in-person evaluation if the symptoms worsen or if the condition fails to improve as anticipated.   Dorothyann Peng, NP

## 2021-04-24 ENCOUNTER — Ambulatory Visit: Payer: BC Managed Care – PPO | Admitting: Family Medicine

## 2021-04-24 VITALS — BP 130/72 | HR 65 | Temp 97.9°F | Wt 327.5 lb

## 2021-04-24 DIAGNOSIS — S300XXA Contusion of lower back and pelvis, initial encounter: Secondary | ICD-10-CM

## 2021-04-24 DIAGNOSIS — M5431 Sciatica, right side: Secondary | ICD-10-CM

## 2021-04-24 MED ORDER — PREDNISONE 10 MG PO TABS: ORAL_TABLET | ORAL | 0 refills | Status: DC

## 2021-04-24 NOTE — Progress Notes (Signed)
Established Patient Office Visit  Subjective:  Patient ID: Miguel Bean, male    DOB: 27-Oct-1960  Age: 60 y.o. MRN: [id number]  CC:  Chief Complaint  Patient presents with   [place] off a ladder on Saturday, landed on back side, very bruised, back pain has faired up since    HPI Miguel Bean presents after a fall on Saturday from a ladder about 6 feet up and landing on his left buttock.  He was on a stepladder and fell off landing on a plastic "tote ".  No head injury.  No loss of consciousness.  He was able to get up and walk without difficulty afterwards.  He noticed some immediate swelling left buttock.  He has applied some ice.  He has extensive bruising at this time the buttock region.  Ambulating without difficulty.  He does have history of some chronic back difficulties with 2 prior microdiscectomies.  This was years ago.  He recently had a flareup with some right-sided sciatica symptoms and this has seemed somewhat worse since the fall.  He had recent Medrol dose taper which helped slightly until the fall.  Denies any direct tenderness over the lumbar spine.  His main issue is right lower extremity pain of a radiculitis type nature.  Denies any right lower extremity weakness.  No urine or stool incontinence.  Past Medical History:  Diagnosis Date   Anxiety and depression    Back pain    Insomnia    PONV (postoperative nausea and vomiting)    Thyroid disease     Past Surgical History:  Procedure Laterality Date   BACK SURGERY  05/28/2006   COLONOSCOPY     LUMBAR LAMINECTOMY  06/22/2011   Procedure: MICRODISCECTOMY LUMBAR LAMINECTOMY;  Surgeon: Marybelle Killings, MD;  Location: [facility];  Service: Orthopedics;  Laterality: Left;  Left L5-S1 Microdiscectomy   VASECTOMY     35    Family History  Problem Relation Age of Onset   Colon polyps Father    Colon cancer Neg Hx    Esophageal cancer Neg Hx    Rectal cancer Neg Hx    Stomach cancer Neg Hx     Social History    Socioeconomic History   Marital status: Married    Spouse name: Not on file   Number of children: Not on file   Years of education: Not on file   Highest education level: 12th grade  Occupational History   Not on file  Tobacco Use   Smoking status: Never   Smokeless tobacco: Never  Vaping Use   Vaping Use: Never used  Substance and Sexual Activity   Alcohol use: No   Drug use: No   Sexual activity: Yes  Other Topics Concern   Not on file  Social History Narrative   Not on file   Social Determinants of Health   Financial Resource Strain: Low Risk    Difficulty of Paying Living Expenses: Not hard at all  Food Insecurity: No Food Insecurity   Worried About Charity fundraiser in the Last Year: Never true   Ran Out of Food in the Last Year: Never true  Transportation Needs: No Transportation Needs   Lack of Transportation (Medical): No   Lack of Transportation (Non-Medical): No  Physical Activity: Insufficiently Active   Days of Exercise per Week: 1 day   Minutes of Exercise per Session: 10 min  Stress: No Stress Concern Present  Feeling of Stress : Only a little  Social Connections: Moderately Isolated   Frequency of Communication with Friends and Family: Once a week   Frequency of Social Gatherings with Friends and Family: Twice a week   Attends Religious Services: Never   Marine scientist or Organizations: No   Attends Music therapist: Not on file   Marital Status: Married  Human resources officer Violence: Not on file    Outpatient Medications Prior to Visit  Medication Sig Dispense Refill   levothyroxine (SYNTHROID) 112 MCG tablet Take 1 tablet (112 mcg total) by mouth daily before breakfast. 90 tablet 2   sertraline (ZOLOFT) 100 MG tablet Take 1 tablet (100 mg total) by mouth daily. 90 tablet 1   traZODone (DESYREL) 100 MG tablet TAKE 1-2 TABLETS (100-200 MG TOTAL) BY MOUTH AT BEDTIME AS NEEDED FOR SLEEP. 180 tablet 1   zaleplon (SONATA) 10 MG  capsule TAKE 1 CAPSULE BY MOUTH AT BEDTIME AS NEEDED , AND MAY REPEAT 1 FOR MIDNOCTURNAL AWAKENING AS LONG AS THERE ARE 3 HOURS LEFT TO SLEEP. 30 capsule 5   methylPREDNISolone (MEDROL DOSEPAK) 4 MG TBPK tablet Take as directed 21 tablet 0   No facility-administered medications prior to visit.    No Known Allergies  ROS Review of Systems  Constitutional:  Negative for activity change, appetite change and fever.  Respiratory:  Negative for cough and shortness of breath.   Cardiovascular:  Negative for chest pain and leg swelling.  Gastrointestinal:  Negative for abdominal pain and vomiting.  Genitourinary:  Negative for dysuria, flank pain and hematuria.  Musculoskeletal:  Positive for back pain. Negative for joint swelling.  Neurological:  Negative for weakness and numbness.     Objective:    Physical Exam Constitutional:      General: He is not in acute distress.    Appearance: He is well-developed.  Neck:     Thyroid: No thyromegaly.  Cardiovascular:     Rate and Rhythm: Normal rate and regular rhythm.     Heart sounds: Normal heart sounds. No murmur heard. Pulmonary:     Effort: Pulmonary effort is normal. No respiratory distress.     Breath sounds: Normal breath sounds. No wheezing or rales.  Musculoskeletal:     Comments: Straight leg raises are negative on the right  No tenderness over lumbar vertebra  Skin:    Findings: No rash.     Comments: Very large area of bruising left buttock about 30 x 18 cm.  He does have some mild swelling superiorly and suspected underlying hematoma.  Minimally tender.  No warmth.  No erythema.  Ambulating without difficulty.  Neurological:     Mental Status: He is alert and oriented to person, place, and time.     Cranial Nerves: No cranial nerve deficit.     Deep Tendon Reflexes: Reflexes are normal and symmetric.    BP 130/72 (BP Location: Left Arm, Patient Position: Sitting, Cuff Size: Normal)   Pulse 65   Temp 97.9 F (36.6 C)  (Oral)   Wt (!) 327 lb 8 oz (148.6 kg)   SpO2 97%   BMI 46.99 kg/m  Wt Readings from Last 3 Encounters:  04/24/21 (!) 327 lb 8 oz (148.6 kg)  04/12/21 (!) 324 lb (147 kg)  03/30/21 (!) 324 lb (147 kg)     Health Maintenance Due  Topic Date Due   COVID-19 Vaccine (3 - Booster for Pfizer series) 12/07/2019   Zoster Vaccines- Shingrix (2  of 2) 03/01/2021    There are no preventive care reminders to display for this patient.  Lab Results  Component Value Date   TSH 2.86 04/05/2021   Lab Results  Component Value Date   WBC 5.4 01/04/2021   HGB 15.6 01/04/2021   HCT 46.9 01/04/2021   MCV 90.6 01/04/2021   PLT 194.0 01/04/2021   Lab Results  Component Value Date   NA 140 01/04/2021   K 4.5 01/04/2021   CO2 27 01/04/2021   GLUCOSE 89 01/04/2021   BUN 14 01/04/2021   CREATININE 1.13 01/04/2021   BILITOT 1.0 01/04/2021   ALKPHOS 77 01/04/2021   AST 22 01/04/2021   ALT 23 01/04/2021   PROT 6.4 01/04/2021   ALBUMIN 3.9 01/04/2021   CALCIUM 9.1 01/04/2021   GFR 70.73 01/04/2021   Lab Results  Component Value Date   CHOL 167 01/04/2021   Lab Results  Component Value Date   HDL 43.50 01/04/2021   Lab Results  Component Value Date   LDLCALC 93 01/04/2021   Lab Results  Component Value Date   TRIG 153.0 (H) 01/04/2021   Lab Results  Component Value Date   CHOLHDL 4 01/04/2021   Lab Results  Component Value Date   HGBA1C 5.4 01/04/2021      Assessment & Plan:   #1 traumatic hematoma left buttock following fall as above.  We recommend at this point he apply some topical heat daily.  He is aware this will take several weeks to fully resolve.  He is ambulating without difficulty.  No clinical suspicion of hip injury.  #2 right-sided sciatica symptoms.  This has been a chronic problem for him with possible exacerbation with recent fall.  No clinical suspicion for vertebral compression fracture. He has benefited from prednisone tapers in the past.  We agreed  to send in 1 more prednisone taper.  If not improving after that we will need to discuss with primary possible further imaging with MRI or referral back to his surgeon   Meds ordered this encounter  Medications   predniSONE (DELTASONE) 10 MG tablet    Sig: Taper as follows  :  4-4-4-3-3-3-2-2-1-1    Dispense:  27 tablet    Refill:  0    Follow-up: No follow-ups on file.    Carolann Littler, MD

## 2021-04-27 ENCOUNTER — Ambulatory Visit: Payer: BC Managed Care – PPO | Admitting: Adult Health

## 2021-05-18 DIAGNOSIS — M5416 Radiculopathy, lumbar region: Secondary | ICD-10-CM | POA: Diagnosis not present

## 2021-05-19 ENCOUNTER — Other Ambulatory Visit: Payer: Self-pay | Admitting: Physician Assistant

## 2021-05-23 ENCOUNTER — Ambulatory Visit (INDEPENDENT_AMBULATORY_CARE_PROVIDER_SITE_OTHER): Payer: BC Managed Care – PPO

## 2021-05-23 DIAGNOSIS — Z23 Encounter for immunization: Secondary | ICD-10-CM

## 2021-06-14 ENCOUNTER — Telehealth: Payer: BC Managed Care – PPO | Admitting: Physician Assistant

## 2021-06-14 DIAGNOSIS — R070 Pain in throat: Secondary | ICD-10-CM | POA: Diagnosis not present

## 2021-06-14 DIAGNOSIS — Z20818 Contact with and (suspected) exposure to other bacterial communicable diseases: Secondary | ICD-10-CM | POA: Diagnosis not present

## 2021-06-14 MED ORDER — AMOXICILLIN 500 MG PO TABS: 500.0000 mg | ORAL_TABLET | Freq: Two times a day (BID) | ORAL | 0 refills | Status: AC

## 2021-06-14 NOTE — Progress Notes (Signed)
I have spent 5 minutes in review of e-visit questionnaire, review and updating patient chart, medical decision making and response to patient.   Brytney Somes Cody Aqsa Sensabaugh, PA-C    

## 2021-06-14 NOTE — Progress Notes (Signed)

## 2021-06-18 ENCOUNTER — Telehealth: Payer: BC Managed Care – PPO | Admitting: Nurse Practitioner

## 2021-06-18 DIAGNOSIS — M109 Gout, unspecified: Secondary | ICD-10-CM

## 2021-06-18 MED ORDER — PREDNISONE 20 MG PO TABS
40.0000 mg | ORAL_TABLET | Freq: Every day | ORAL | 0 refills | Status: AC
Start: 2021-06-18 — End: 2021-06-25

## 2021-06-18 NOTE — Progress Notes (Signed)
I have spent 5 minutes in review of e-visit questionnaire, review and updating patient chart, medical decision making and response to patient.  ° °Rome Schlauch W Yavier Snider, NP ° °  °

## 2021-06-18 NOTE — Progress Notes (Signed)
E-Visit for Gout Symptoms  We are sorry that you are not feeling well. We are here to help!  Based on what you shared with me it looks like you have a flare of your gout.  Gout is a form of arthritis. It can cause pain and swelling in the joints. At first, it tends to affect only 1 joint - most frequently the big toe. It happens in people who have too much uric acid in the blood. Uric acid is a chemical that is produced when the body breaks down certain foods. Uric acid can form sharp needle-like crystals that build up in the joints and cause pain. Uric acid crystals can also form inside the tubes that carry urine from the kidneys to the bladder. These crystals can turn into "kidney stones" that can cause pain and problems with the flow of urine. People with gout get sudden "flares" or attacks of severe pain, most often the big toe, ankle, or knee. Often the joint also turns red and swells. Usually, only 1 joint is affected, but some people have pain in more than 1 joint. Gout flares tend to happen more often during the night.  The pain from gout can be extreme. The pain and swelling are worst at the beginning of a gout flare. The symptoms then get better within a few days to weeks. It is not clear how the body "turns off" a gout flare.  Do not start any NEW preventative medicine until the gout has cleared completely. However, If you are already on Probenecid or Allopurinol for CHRONIC gout, you may continue taking this during an active flare up  I have prescribed Prednisone 40 mg daily for 7 days.    HOME CARE Losing weight can help relieve gout. It's not clear that following a specific diet plan will help with gout symptoms but eating a balanced diet can help improve your overall health. It can also help you lose weight, if you are overweight. In general, a healthy diet includes plenty of fruits, vegetables, whole grains, and low-fat dairy products (labelled "low fat", skim, 2%). Avoid sugar  sweetened drinks (including sodas, tea, juice and juice blends, coffee drinks and sports drinks) Limit alcohol to 1-2 drinks of beer, spirits or wine daily these can make gout flares worse. Some people with gout also have other health problems, such as heart disease, high blood pressure, kidney disease, or obesity. If you have any of these issues, it's important to work with your doctor to manage them. This can help improve your overall health and might also help with your gout.  GET HELP RIGHT AWAY IF: Your symptoms persist after you have completed your treatment plan You develop severe diarrhea You develop abnormal sensations  You develop vomiting,   You develop weakness  You develop abdominal pain  FOLLOW UP WITH YOUR PRIMARY PROVIDER IF: If your symptoms do not improve within 10 days  MAKE SURE YOU  Understand these instructions. Will watch your condition. Will get help right away if you are not doing well or get worse.  Thank you for choosing an e-visit.  Your e-visit answers were reviewed by a board certified advanced clinical practitioner to complete your personal care plan. Depending upon the condition, your plan could have included both over the counter or prescription medications.  Please review your pharmacy choice. Make sure the pharmacy is open so you can pick up prescription now. If there is a problem, you may contact your provider through MyChart messaging   and have the prescription routed to another pharmacy.  Your safety is important to us. If you have drug allergies check your prescription carefully.   For the next 24 hours you can use MyChart to ask questions about today's visit, request a non-urgent call back, or ask for a work or school excuse. You will get an email in the next two days asking about your experience. I hope that your e-visit has been valuable and will speed your recovery.  

## 2021-06-22 DIAGNOSIS — M545 Low back pain, unspecified: Secondary | ICD-10-CM | POA: Diagnosis not present

## 2021-06-26 DIAGNOSIS — M5416 Radiculopathy, lumbar region: Secondary | ICD-10-CM | POA: Diagnosis not present

## 2021-07-03 DIAGNOSIS — M5416 Radiculopathy, lumbar region: Secondary | ICD-10-CM | POA: Diagnosis not present

## 2021-07-07 ENCOUNTER — Telehealth: Payer: Self-pay | Admitting: Adult Health

## 2021-07-07 ENCOUNTER — Encounter: Payer: Self-pay | Admitting: Adult Health

## 2021-07-07 ENCOUNTER — Ambulatory Visit: Payer: BC Managed Care – PPO | Admitting: Adult Health

## 2021-07-07 VITALS — BP 120/60 | HR 90 | Temp 98.9°F | Ht 70.0 in | Wt 318.0 lb

## 2021-07-07 DIAGNOSIS — H6690 Otitis media, unspecified, unspecified ear: Secondary | ICD-10-CM | POA: Diagnosis not present

## 2021-07-07 DIAGNOSIS — J020 Streptococcal pharyngitis: Secondary | ICD-10-CM | POA: Diagnosis not present

## 2021-07-07 DIAGNOSIS — R0981 Nasal congestion: Secondary | ICD-10-CM | POA: Diagnosis not present

## 2021-07-07 DIAGNOSIS — J029 Acute pharyngitis, unspecified: Secondary | ICD-10-CM

## 2021-07-07 LAB — POC COVID19 BINAXNOW: SARS Coronavirus 2 Ag: NEGATIVE

## 2021-07-07 LAB — POCT RAPID STREP A (OFFICE): Rapid Strep A Screen: POSITIVE — AB

## 2021-07-07 LAB — POCT INFLUENZA A/B
Influenza A, POC: NEGATIVE
Influenza B, POC: NEGATIVE

## 2021-07-07 MED ORDER — PREDNISONE 10 MG PO TABS: 10.0000 mg | ORAL_TABLET | Freq: Every day | ORAL | 0 refills | Status: AC

## 2021-07-07 MED ORDER — AMOXICILLIN-POT CLAVULANATE 875-125 MG PO TABS: 1.0000 | ORAL_TABLET | Freq: Two times a day (BID) | ORAL | 0 refills | Status: DC

## 2021-07-07 MED ORDER — MAGIC MOUTHWASH W/LIDOCAINE: 5.0000 mL | Freq: Three times a day (TID) | ORAL | 0 refills | Status: DC | PRN

## 2021-07-07 NOTE — Progress Notes (Signed)
Subjective:    Patient ID: Miguel Bean, male    DOB: 12-23-1960, 61 y.o.   MRN: [id number]  HPI 61 year old male who  has a past medical history of Anxiety and depression, Back pain, Insomnia, PONV (postoperative nausea and vomiting), and Thyroid disease.   He presents to the office today for an acute complaint. His symptoms started roughly 6 days ago. Symptoms have been getting worse over the week.  His symptoms include sore throat, coughing, sided ear pain with drainage, fatigue, hoarse voice, sinus congestion, and chills which have resolved.  Denies fevers  His wife is at home with a cough and has had strep throat twice.    Review of Systems Past Medical History:  Diagnosis Date   Anxiety and depression    Back pain    Insomnia    PONV (postoperative nausea and vomiting)    Thyroid disease     Social History   Socioeconomic History   Marital status: Married    Spouse name: Not on file   Number of children: Not on file   Years of education: Not on file   Highest education level: 12th grade  Occupational History   Not on file  Tobacco Use   Smoking status: Never   Smokeless tobacco: Never  Vaping Use   Vaping Use: Never used  Substance and Sexual Activity   Alcohol use: No   Drug use: No   Sexual activity: Yes  Other Topics Concern   Not on file  Social History Narrative   Not on file   Social Determinants of Health   Financial Resource Strain: Low Risk    Difficulty of Paying Living Expenses: Not hard at all  Food Insecurity: No Food Insecurity   Worried About Charity fundraiser in the Last Year: Never true   Ran Out of Food in the Last Year: Never true  Transportation Needs: No Transportation Needs   Lack of Transportation (Medical): No   Lack of Transportation (Non-Medical): No  Physical Activity: Insufficiently Active   Days of Exercise per Week: 1 day   Minutes of Exercise per Session: 10 min  Stress: No Stress Concern Present   Feeling of  Stress : Only a little  Social Connections: Moderately Isolated   Frequency of Communication with Friends and Family: Once a week   Frequency of Social Gatherings with Friends and Family: Twice a week   Attends Religious Services: Never   Marine scientist or Organizations: No   Attends Music therapist: Not on file   Marital Status: Married  Human resources officer Violence: Not on file    Past Surgical History:  Procedure Laterality Date   BACK SURGERY  05/28/2006   COLONOSCOPY     LUMBAR LAMINECTOMY  06/22/2011   Procedure: MICRODISCECTOMY LUMBAR LAMINECTOMY;  Surgeon: Marybelle Killings, MD;  Location: [facility];  Service: Orthopedics;  Laterality: Left;  Left L5-S1 Microdiscectomy   VASECTOMY     30    Family History  Problem Relation Age of Onset   Colon polyps Father    Colon cancer Neg Hx    Esophageal cancer Neg Hx    Rectal cancer Neg Hx    Stomach cancer Neg Hx     No Known Allergies  Current Outpatient Medications on File Prior to Visit  Medication Sig Dispense Refill   levothyroxine (SYNTHROID) 112 MCG tablet Take 1 tablet (112 mcg total) by mouth daily before breakfast. 90 tablet 2  sertraline (ZOLOFT) 100 MG tablet Take 1 tablet (100 mg total) by mouth daily. 90 tablet 1   traZODone (DESYREL) 100 MG tablet TAKE 1-2 TABLETS (100-200 MG TOTAL) BY MOUTH AT BEDTIME AS NEEDED FOR SLEEP. 180 tablet 1   zaleplon (SONATA) 10 MG capsule TAKE 1 CAPSULE BY MOUTH AT BEDTIME AS NEEDED , AND MAY REPEAT 1 FOR MIDNOCTURNAL AWAKENING AS LONG AS THERE ARE 3 HOURS LEFT TO SLEEP. 30 capsule 5   No current facility-administered medications on file prior to visit.    Pulse 90    Temp 98.9 F (37.2 C) (Oral)    Ht 5\' 10"  (1.778 m)    Wt (!) 318 lb (144.2 kg)    SpO2 96%    BMI 45.63 kg/m       Objective:   Physical Exam Vitals and nursing note reviewed.  Constitutional:      Appearance: Normal appearance. He is well-developed. He is obese. He is ill-appearing.  HENT:      Right Ear: Drainage present. Tympanic membrane is erythematous and bulging. Tympanic membrane is not perforated.     Left Ear: Tympanic membrane, ear canal and external ear normal. There is no impacted cerumen. Tympanic membrane is not erythematous or bulging.     Mouth/Throat:     Pharynx: Uvula midline. Posterior oropharyngeal erythema present. No uvula swelling.     Tonsils: Tonsillar exudate present. No tonsillar abscesses. 2+ on the right. 2+ on the left.  Cardiovascular:     Rate and Rhythm: Normal rate and regular rhythm.     Pulses: Normal pulses.     Heart sounds: Normal heart sounds.  Pulmonary:     Effort: Pulmonary effort is normal.     Breath sounds: Normal breath sounds.  Skin:    General: Skin is warm and dry.     Capillary Refill: Capillary refill takes less than 2 seconds.  Neurological:     General: No focal deficit present.     Mental Status: He is alert and oriented to person, place, and time.  Psychiatric:        Mood and Affect: Mood normal.        Behavior: Behavior normal.        Thought Content: Thought content normal.      Assessment & Plan:  1. Sore throat  - POC Influenza A/B- negative - POC COVID-19- negative  - POC Rapid Strep A- POSITIVE  2. Nasal congestion  - POC Influenza A/B - POC COVID-19  3. Strep throat -No peritonsillar abscess noted.  Will treat with strep throat with Augmentin to cover for otitis media as well.  Give prednisone to help with pain relief and Magic mouthwash.  Advised warm drinks and salt water gargles.  Follow-up if no improvement in the next 2 to 3 days. - amoxicillin-clavulanate (AUGMENTIN) 875-125 MG tablet; Take 1 tablet by mouth 2 (two) times daily.  Dispense: 20 tablet; Refill: 0 - predniSONE (DELTASONE) 10 MG tablet; Take 1 tablet (10 mg total) by mouth daily with breakfast for 7 days.  Dispense: 7 tablet; Refill: 0 - magic mouthwash w/lidocaine SOLN; Take 5 mLs by mouth 3 (three) times daily as needed.   Dispense: 180 mL; Refill: 0 - 4. Acute otitis media, unspecified otitis media type  - amoxicillin-clavulanate (AUGMENTIN) 875-125 MG tablet; Take 1 tablet by mouth 2 (two) times daily.  Dispense: 20 tablet; Refill: 0  Dorothyann Peng, NP

## 2021-07-07 NOTE — Telephone Encounter (Signed)
Patient calling in with respiratory symptoms: Shortness of breath, chest pain, palpitations or other red words send to Triage  Does the patient have a fever over 100, cough, congestion, sore throat, runny nose, lost of taste/smell (please list symptoms that patient has)?congestion, sore throat, muffled hearing in one ear  What date did symptoms start? 02/04 (If over 5 days ago, pt may be scheduled for in person visit)  Have you tested for Covid in the last 5 days? No   If yes, was it positive OR negative ? If positive in the last 5 days, please schedule virtual visit now. If negative, schedule for an in person OV with the next available provider if PCP has no openings. Please also let patient know they will be tested again (follow the script below)  "you will have to arrive 34mins prior to your appt time to be Covid tested. Please park in back of office at the cone & call [phone] to let the staff know you have arrived. A staff member will meet you at your car to do a rapid covid test. Once the test has resulted you will be notified by phone of your results to determine if appt will remain an in person visit or be converted to a virtual/phone visit. If you arrive less than 71mins before your appt time, your visit will be automatically converted to virtual & any recommended testing will happen AFTER the visit."   [place]  If no availability for virtual visit in office,  please schedule another [place] office  If no availability at another [place] office, please instruct patient that they can schedule an evisit or virtual visit through their mychart account. Visits up to 8pm  patients can be seen in office 5 days after positive COVID test

## 2021-07-10 ENCOUNTER — Encounter: Payer: Self-pay | Admitting: Physician Assistant

## 2021-07-10 ENCOUNTER — Telehealth: Payer: BC Managed Care – PPO | Admitting: Physician Assistant

## 2021-07-10 DIAGNOSIS — F3342 Major depressive disorder, recurrent, in full remission: Secondary | ICD-10-CM | POA: Diagnosis not present

## 2021-07-10 DIAGNOSIS — G4700 Insomnia, unspecified: Secondary | ICD-10-CM

## 2021-07-10 DIAGNOSIS — F411 Generalized anxiety disorder: Secondary | ICD-10-CM | POA: Diagnosis not present

## 2021-07-10 MED ORDER — TRAZODONE HCL 100 MG PO TABS: 100.0000 mg | ORAL_TABLET | Freq: Every evening | ORAL | 1 refills | Status: DC | PRN

## 2021-07-10 MED ORDER — SERTRALINE HCL 100 MG PO TABS: 100.0000 mg | ORAL_TABLET | Freq: Every day | ORAL | 1 refills | Status: DC

## 2021-07-10 NOTE — Progress Notes (Signed)
[facility]  Patient ID: Miguel Bean,  MRN: [id number]  PCP: Dorothyann Peng, NP  Date of Evaluation: 07/10/2021  time spent:20 minutes  Chief Complaint:  Chief Complaint   Depression; Insomnia; Follow-up    Virtual Visit via Telehealth  I connected with patient by a video enabled telemedicine application with their informed consent, and verified patient privacy and that I am speaking with the correct person using two identifiers.  I am private, in my office and the patient is at home.  I discussed the limitations, risks, security and privacy concerns of performing an evaluation and management service by video and the availability of in person appointments. I also discussed with the patient that there may be a patient responsible charge related to this service. The patient expressed understanding and agreed to proceed.   I discussed the assessment and treatment plan with the patient. The patient was provided an opportunity to ask questions and all were answered. The patient agreed with the plan and demonstrated an understanding of the instructions.   The patient was advised to call back or seek an in-person evaluation if the symptoms worsen or if the condition fails to improve as anticipated.  I provided 20 minutes of non-face-to-face time during this encounter.   HISTORY/CURRENT STATUS: HPI  For 6 month med check.  As far as his mental health goes, he's doing well.  He is sleeping well since we added the Sonata.  Does not need it every night but it is helpful when he wakes up in the middle of the night and can take it.  He still uses the trazodone.  That is helpful as well.  He has been having physical health problems ever since Thanksgiving.  See review of systems.  Patient denies loss of interest in usual activities and is able to enjoy things.  Denies decreased energy or motivation except for being sick with a respiratory infection right now.  Appetite has not  changed.  No extreme sadness, tearfulness, or feelings of hopelessness.  Denies any changes in concentration, making decisions or remembering things.  Work is going well.  Denies suicidal or homicidal thoughts.  Patient denies increased energy with decreased need for sleep, no increased talkativeness, no racing thoughts, no impulsivity or risky behaviors, no increased spending, no increased libido, no grandiosity, no increased irritability or anger, and no hallucinations.  Denies dizziness, syncope, seizures, numbness, tingling, tremor, tics, unsteady gait, slurred speech, confusion.  Having a lot of back pain secondary to the injury from the fall.  No dystonia or other abnormal movements.    Individual Medical History/ Review of Systems: Changes? :Yes    Has URI at present. [place] getting Christmas decorations down, Thanksgiving weekend, hurt his back and has had to get injections in his back.      Past medications for mental health diagnoses include: Zoloft, Melatonin, Trazodone  Allergies: Patient has no known allergies.  Current Medications:  Current Outpatient Medications:    amoxicillin-clavulanate (AUGMENTIN) 875-125 MG tablet, Take 1 tablet by mouth 2 (two) times daily., Disp: 20 tablet, Rfl: 0   levothyroxine (SYNTHROID) 112 MCG tablet, Take 1 tablet (112 mcg total) by mouth daily before breakfast., Disp: 90 tablet, Rfl: 2   magic mouthwash w/lidocaine SOLN, Take 5 mLs by mouth 3 (three) times daily as needed., Disp: 180 mL, Rfl: 0   predniSONE (DELTASONE) 10 MG tablet, Take 1 tablet (10 mg total) by mouth daily with breakfast for 7 days., Disp: 7 tablet, Rfl: 0  zaleplon (SONATA) 10 MG capsule, TAKE 1 CAPSULE BY MOUTH AT BEDTIME AS NEEDED , AND MAY REPEAT 1 FOR MIDNOCTURNAL AWAKENING AS LONG AS THERE ARE 3 HOURS LEFT TO SLEEP., Disp: 30 capsule, Rfl: 5   sertraline (ZOLOFT) 100 MG tablet, Take 1 tablet (100 mg total) by mouth daily., Disp: 90 tablet, Rfl: 1   traZODone (DESYREL) 100  MG tablet, Take 1-2 tablets (100-200 mg total) by mouth at bedtime as needed for sleep., Disp: 180 tablet, Rfl: 1 Medication Side Effects: none  Family Medical/ Social History: Changes?  No  MENTAL HEALTH EXAM:  There were no vitals taken for this visit.There is no height or weight on file to calculate BMI.  General Appearance: Casual, Neat, Well Groomed and Obese  Eye Contact:  Good  Speech:  Clear and Coherent, Normal Rate, and is hoarse  Volume:  Normal  Mood:  Euthymic  Affect:  Appropriate  Thought Process:  Goal Directed and Descriptions of Associations: Circumstantial  Orientation:  Full (Time, Place, and Person)  Thought Content: Logical   Suicidal Thoughts:  No  Homicidal Thoughts:  No  Memory:  WNL  Judgement:  Good  Insight:  Good  Psychomotor Activity:  Normal  Concentration:  Concentration: Good and Attention Span: Good  Recall:  Good  Fund of Knowledge: Good  Language: Good  Assets:  Desire for Improvement  ADL's:  Intact  Cognition: WNL  Prognosis:  Good    DIAGNOSES:    ICD-10-CM   1. Major depression, recurrent, full remission ([place])  F33.42     2. Generalized anxiety disorder  F41.1     3. Insomnia, unspecified type  G47.00        Receiving Psychotherapy: No    RECOMMENDATIONS:  PDMP reviewed.  Sonata last filled 06/28/2021 I provided 20 minutes of non-face-to-face time during this encounter, including time spent before and after the visit in records review, medical decision making, counseling pertinent to today's visit, and charting.  He is doing fine as far as his mental health meds go so no changes will be made. Wished him a speedy recovery.  Continue Zoloft 100 mg, 1 p.o. daily. Continue trazodone 100 mg, 1-2 p.o. nightly as needed sleep. Continue Sonata 10 mg, 1 p.o. nightly as needed and may repeat 1 for mid nocturnal awakening as needed as long as he has 3 hours left to sleep. Return in 6 months.  Donnal Moat, PA-C

## 2021-07-19 DIAGNOSIS — M5416 Radiculopathy, lumbar region: Secondary | ICD-10-CM | POA: Diagnosis not present

## 2021-08-30 DIAGNOSIS — M5416 Radiculopathy, lumbar region: Secondary | ICD-10-CM | POA: Diagnosis not present

## 2021-09-07 DIAGNOSIS — M5416 Radiculopathy, lumbar region: Secondary | ICD-10-CM | POA: Diagnosis not present

## 2021-10-03 DIAGNOSIS — M5416 Radiculopathy, lumbar region: Secondary | ICD-10-CM | POA: Diagnosis not present

## 2021-10-11 DIAGNOSIS — M5416 Radiculopathy, lumbar region: Secondary | ICD-10-CM | POA: Diagnosis not present

## 2021-10-12 ENCOUNTER — Other Ambulatory Visit: Payer: Self-pay | Admitting: Physician Assistant

## 2021-11-27 DIAGNOSIS — M5416 Radiculopathy, lumbar region: Secondary | ICD-10-CM | POA: Diagnosis not present

## 2021-12-18 ENCOUNTER — Other Ambulatory Visit: Payer: Self-pay | Admitting: Physician Assistant

## 2021-12-30 ENCOUNTER — Other Ambulatory Visit: Payer: Self-pay | Admitting: Adult Health

## 2022-01-05 ENCOUNTER — Other Ambulatory Visit: Payer: Self-pay | Admitting: Physician Assistant

## 2022-01-05 ENCOUNTER — Other Ambulatory Visit: Payer: Self-pay

## 2022-01-05 MED ORDER — ZALEPLON 10 MG PO CAPS: ORAL_CAPSULE | ORAL | 0 refills | Status: DC

## 2022-01-05 NOTE — Telephone Encounter (Signed)
Pt is scheduled 8/28

## 2022-01-05 NOTE — Telephone Encounter (Signed)
Filled 5/18

## 2022-01-05 NOTE — Telephone Encounter (Signed)
Please schedule appt

## 2022-01-22 ENCOUNTER — Ambulatory Visit: Payer: BC Managed Care – PPO | Admitting: Physician Assistant

## 2022-01-22 ENCOUNTER — Encounter: Payer: Self-pay | Admitting: Physician Assistant

## 2022-01-22 DIAGNOSIS — G4700 Insomnia, unspecified: Secondary | ICD-10-CM

## 2022-01-22 DIAGNOSIS — F411 Generalized anxiety disorder: Secondary | ICD-10-CM

## 2022-01-22 DIAGNOSIS — F331 Major depressive disorder, recurrent, moderate: Secondary | ICD-10-CM | POA: Diagnosis not present

## 2022-01-22 MED ORDER — SERTRALINE HCL 100 MG PO TABS: 150.0000 mg | ORAL_TABLET | Freq: Every day | ORAL | 1 refills | Status: DC

## 2022-01-22 NOTE — Progress Notes (Signed)
[facility]  Patient ID: Miguel Bean,  MRN: [id number]  PCP: Dorothyann Peng, NP  Date of Evaluation: 01/22/2022  Time spent:20 minutes  Chief Complaint:  Chief Complaint   Insomnia; Depression    HISTORY/CURRENT STATUS: HPI  For 6 month med check.  Takes Sonata almost every night, it helps him go to sleep. But doesn't keep him asleep. Wakes up between 4:30-6:30 and can't take another Sonata b/c it's too late for that. States he goes to sleep around 10:30.  He used to be able to sleep late on weekends and would like to again. No caffeine or screen use late in the day.  Has a bit more anxiety, 61 yo mother 'won't listen and do what she's told to do.'  That affects him emotionally.  Does not feel like the Zoloft is working as well anymore.  He is not having panic attacks but has an overwhelming sense of unease sometimes.  He is able to enjoy things.  Energy and motivation are fair to good depending on the day.  He and his wife take care of their small grandchildren as often as they can.  They enjoy spending time with them.  ADLs and personal hygiene are normal.  Appetite is normal and weight is stable.  Not isolating.  Work is going well.  No suicidal or homicidal thoughts.  Denies dizziness, syncope, seizures, numbness, tingling, tremor, tics, unsteady gait, slurred speech, confusion.  Denies muscle or joint pain, stiffness, or dystonia.  Individual Medical History/ Review of Systems: Changes? :Yes   has had several injections in his back after injuring it from falling from his storage room.Doing better.    Past medications for mental health diagnoses include: Zoloft, Melatonin, Trazodone  Allergies: Patient has no known allergies.  Current Medications:  Current Outpatient Medications:    gabapentin (NEURONTIN) 600 MG tablet, Take 600 mg by mouth 3 (three) times daily., Disp: , Rfl:    levothyroxine (SYNTHROID) 112 MCG tablet, Take 1 tablet (112 mcg total) by mouth  daily before breakfast. SCHEDULE APPT FOR FUTURE REFILLS, Disp: 30 tablet, Rfl: 0   traZODone (DESYREL) 100 MG tablet, Take 1-2 tablets (100-200 mg total) by mouth at bedtime as needed for sleep., Disp: 180 tablet, Rfl: 1   zaleplon (SONATA) 10 MG capsule, TAKE 1 CAPSULE BY MOUTH AT BEDTIME AS NEEDED , AND MAY REPEAT 1 FOR MIDNOCTURNAL AWAKENING AS LONG AS THERE ARE 3 HOURS LEFT TO SLEEP., Disp: 30 capsule, Rfl: 0   amoxicillin-clavulanate (AUGMENTIN) 875-125 MG tablet, Take 1 tablet by mouth 2 (two) times daily., Disp: 20 tablet, Rfl: 0   magic mouthwash w/lidocaine SOLN, Take 5 mLs by mouth 3 (three) times daily as needed., Disp: 180 mL, Rfl: 0   sertraline (ZOLOFT) 100 MG tablet, Take 1.5 tablets (150 mg total) by mouth daily., Disp: 135 tablet, Rfl: 1 Medication Side Effects: none  Family Medical/ Social History: Changes?  No  MENTAL HEALTH EXAM:  There were no vitals taken for this visit.There is no height or weight on file to calculate BMI.  General Appearance: Casual, Neat, Well Groomed and Obese  Eye Contact:  Good  Speech:  Clear and Coherent and Normal Rate  Volume:  Normal  Mood:  Euthymic  Affect:  Appropriate  Thought Process:  Goal Directed and Descriptions of Associations: Circumstantial  Orientation:  Full (Time, Place, and Person)  Thought Content: Logical   Suicidal Thoughts:  No  Homicidal Thoughts:  No  Memory:  WNL  Judgement:  Good  Insight:  Good  Psychomotor Activity:  Normal  Concentration:  Concentration: Good and Attention Span: Good  Recall:  Good  Fund of Knowledge: Good  Language: Good  Assets:  Desire for Improvement  ADL's:  Intact  Cognition: WNL  Prognosis:  Good    DIAGNOSES:    ICD-10-CM   1. Major depressive disorder, recurrent episode, moderate ([facility])  F33.1     2. Generalized anxiety disorder  F41.1     3. Insomnia, unspecified type  G47.00       Receiving Psychotherapy: No   RECOMMENDATIONS:  PDMP reviewed.  Sonata last filled  01/05/2022. I provided 20 minutes of face to face time during this encounter, including time spent before and after the visit in records review, medical decision making, counseling pertinent to today's visit, and charting.   Discussed the Zoloft and recommend increasing the dose.  Sometimes medications like this will "poop out" and dose needs to be changed. Another issue is stress from helping out with his 64 yo Mom.   Discussed sleep hygiene. It sounds like he's getting around 7-8 hours of sleep most nights which is to be expected.  I do not feel this is abnormal.  As we age, her sleep patterns changed as well.  If he wants to sleep later on weekends, I suggest he go to bed a little later.  Increase Zoloft 100 mg, 1.5 daily. Continue trazodone 100 mg, 1-2 p.o. nightly as needed sleep. Continue Sonata 10 mg, 1 p.o. nightly as needed and may repeat 1 for mid nocturnal awakening as needed as long as he has 3 hours left to sleep. Recommend counseling.  Return in 6-8 weeks.   Donnal Moat, PA-C

## 2022-01-30 ENCOUNTER — Other Ambulatory Visit: Payer: Self-pay | Admitting: Adult Health

## 2022-02-06 ENCOUNTER — Ambulatory Visit (INDEPENDENT_AMBULATORY_CARE_PROVIDER_SITE_OTHER): Payer: BC Managed Care – PPO | Admitting: Adult Health

## 2022-02-06 ENCOUNTER — Encounter: Payer: Self-pay | Admitting: Adult Health

## 2022-02-06 VITALS — BP 110/60 | HR 67 | Temp 98.0°F | Ht 70.0 in | Wt 332.0 lb

## 2022-02-06 DIAGNOSIS — Z0001 Encounter for general adult medical examination with abnormal findings: Secondary | ICD-10-CM | POA: Diagnosis not present

## 2022-02-06 DIAGNOSIS — M545 Low back pain, unspecified: Secondary | ICD-10-CM

## 2022-02-06 DIAGNOSIS — E039 Hypothyroidism, unspecified: Secondary | ICD-10-CM

## 2022-02-06 DIAGNOSIS — G8929 Other chronic pain: Secondary | ICD-10-CM

## 2022-02-06 DIAGNOSIS — F32A Depression, unspecified: Secondary | ICD-10-CM

## 2022-02-06 DIAGNOSIS — M25562 Pain in left knee: Secondary | ICD-10-CM

## 2022-02-06 DIAGNOSIS — Z23 Encounter for immunization: Secondary | ICD-10-CM | POA: Diagnosis not present

## 2022-02-06 DIAGNOSIS — F5101 Primary insomnia: Secondary | ICD-10-CM | POA: Diagnosis not present

## 2022-02-06 DIAGNOSIS — Z125 Encounter for screening for malignant neoplasm of prostate: Secondary | ICD-10-CM | POA: Diagnosis not present

## 2022-02-06 DIAGNOSIS — M25561 Pain in right knee: Secondary | ICD-10-CM

## 2022-02-06 DIAGNOSIS — E66813 Obesity, class 3: Secondary | ICD-10-CM

## 2022-02-06 DIAGNOSIS — F419 Anxiety disorder, unspecified: Secondary | ICD-10-CM

## 2022-02-06 LAB — CBC WITH DIFFERENTIAL/PLATELET
Basophils Absolute: 0.1 10*3/uL (ref 0.0–0.1)
Basophils Relative: 0.9 % (ref 0.0–3.0)
Eosinophils Absolute: 0.2 10*3/uL (ref 0.0–0.7)
Eosinophils Relative: 3.2 % (ref 0.0–5.0)
HCT: 46.6 % (ref 39.0–52.0)
Hemoglobin: 15.9 g/dL (ref 13.0–17.0)
Lymphocytes Relative: 33.6 % (ref 12.0–46.0)
Lymphs Abs: 1.9 10*3/uL (ref 0.7–4.0)
MCHC: 34.1 g/dL (ref 30.0–36.0)
MCV: 90.5 fl (ref 78.0–100.0)
Monocytes Absolute: 0.5 10*3/uL (ref 0.1–1.0)
Monocytes Relative: 9 % (ref 3.0–12.0)
Neutro Abs: 3 10*3/uL (ref 1.4–7.7)
Neutrophils Relative %: 53.3 % (ref 43.0–77.0)
Platelets: 183 10*3/uL (ref 150.0–400.0)
RBC: 5.15 Mil/uL (ref 4.22–5.81)
RDW: 13.5 % (ref 11.5–15.5)
WBC: 5.7 10*3/uL (ref 4.0–10.5)

## 2022-02-06 LAB — PSA: PSA: 0.84 ng/mL (ref 0.10–4.00)

## 2022-02-06 LAB — COMPREHENSIVE METABOLIC PANEL
ALT: 22 U/L (ref 0–53)
AST: 23 U/L (ref 0–37)
Albumin: 3.8 g/dL (ref 3.5–5.2)
Alkaline Phosphatase: 82 U/L (ref 39–117)
BUN: 16 mg/dL (ref 6–23)
CO2: 28 mEq/L (ref 19–32)
Calcium: 9.3 mg/dL (ref 8.4–10.5)
Chloride: 104 mEq/L (ref 96–112)
Creatinine, Ser: 1.14 mg/dL (ref 0.40–1.50)
GFR: 69.45 mL/min (ref >60.00)
Glucose, Bld: 86 mg/dL (ref 70–99)
Potassium: 4.5 mEq/L (ref 3.5–5.1)
Sodium: 140 mEq/L (ref 135–145)
Total Bilirubin: 1 mg/dL (ref 0.2–1.2)
Total Protein: 6.4 g/dL (ref 6.0–8.3)

## 2022-02-06 LAB — LIPID PANEL
Cholesterol: 169 mg/dL (ref 0–200)
HDL: 41.9 mg/dL (ref >39.00)
LDL Cholesterol: 105 mg/dL — ABNORMAL HIGH (ref 0–99)
NonHDL: 127.23
Total CHOL/HDL Ratio: 4
Triglycerides: 113 mg/dL (ref 0.0–149.0)
VLDL: 22.6 mg/dL (ref 0.0–40.0)

## 2022-02-06 LAB — HEMOGLOBIN A1C: Hgb A1c MFr Bld: 5.6 % (ref 4.6–6.5)

## 2022-02-06 LAB — TSH: TSH: 9.19 u[IU]/mL — ABNORMAL HIGH (ref 0.35–5.50)

## 2022-02-06 NOTE — Progress Notes (Addendum)
Subjective:    Patient ID: Miguel Bean, male    DOB: 15-Jul-1960, 61 y.o.   MRN: [id number]  HPI Patient presents for yearly preventative medicine examination. He is a pleasant 61 year old male who  has a past medical history of Anxiety and depression, Back pain, Insomnia, PONV (postoperative nausea and vomiting), and Thyroid disease.  Insomnia-managed by behavioral health.  He is currently prescribed trazodone 100 to 200 mg nightly and Sonata 10 mg as needed. Reports that he continues to have trouble falling asleep.   Anxiety/depression-managed with Zoloft 150 mg daily.  He feels well controlled on this medication  Hypothyroidism-managed with Synthroid 112 mcg daily.  Morbid Obesity -   Wt Readings from Last 5 Encounters:  02/06/22 (!) 332 lb (150.6 kg)  07/07/21 (!) 318 lb (144.2 kg)  04/24/21 (!) 327 lb 8 oz (148.6 kg)  04/12/21 (!) 324 lb (147 kg)  03/30/21 (!) 324 lb (147 kg)   Chronic Back Pain - managed by [facility] and has tried injections in the back which helped and is also on Gabapentin 600 mg TID. Reports being pain free currently.   Chronic Knee Pain - has known significant arthritis in both knees. Feels as though this is what keeps him from exercising as much as he would like. He is thinking about having one of his knee replaced this year   All immunizations and health maintenance protocols were reviewed with the patient and needed orders were placed.  Appropriate screening laboratory values were ordered for the patient including screening of hyperlipidemia, renal function and hepatic function. If indicated by BPH, a PSA was ordered.  Medication reconciliation,  past medical history, social history, problem list and allergies were reviewed in detail with the patient  Goals were established with regard to weight loss, exercise, and  diet in compliance with medications  He is up to date on routine colon cancer screening   Review of Systems  Constitutional:  Negative.   HENT: Negative.    Eyes: Negative.   Respiratory: Negative.    Cardiovascular: Negative.   Gastrointestinal: Negative.   Endocrine: Negative.   Genitourinary: Negative.   Musculoskeletal:  Positive for arthralgias.  Skin: Negative.   Allergic/Immunologic: Negative.   Neurological: Negative.   Hematological: Negative.   Psychiatric/Behavioral: Negative.    All other systems reviewed and are negative.  Past Medical History:  Diagnosis Date   Anxiety and depression    Back pain    Insomnia    PONV (postoperative nausea and vomiting)    Thyroid disease     Social History   Socioeconomic History   Marital status: Married    Spouse name: Not on file   Number of children: Not on file   Years of education: Not on file   Highest education level: 12th grade  Occupational History   Not on file  Tobacco Use   Smoking status: Never   Smokeless tobacco: Never  Vaping Use   Vaping Use: Never used  Substance and Sexual Activity   Alcohol use: No   Drug use: No   Sexual activity: Yes  Other Topics Concern   Not on file  Social History Narrative   Not on file   Social Determinants of Health   Financial Resource Strain: Low Risk  (04/24/2021)   Overall Financial Resource Strain (CARDIA)    Difficulty of Paying Living Expenses: Not hard at all  Food Insecurity: No Food Insecurity (04/24/2021)   Hunger Vital Sign  Worried About Charity fundraiser in the Last Year: Never true    [place] in the Last Year: Never true  Transportation Needs: No Transportation Needs (04/24/2021)   PRAPARE - Hydrologist (Medical): No    Lack of Transportation (Non-Medical): No  Physical Activity: Insufficiently Active (04/24/2021)   Exercise Vital Sign    Days of Exercise per Week: 1 day    Minutes of Exercise per Session: 10 min  Stress: No Stress Concern Present (04/24/2021)   [place]    Feeling of Stress : Only a little  Social Connections: Moderately Isolated (04/24/2021)   Social Connection and Isolation Panel [NHANES]    Frequency of Communication with Friends and Family: Once a week    Frequency of Social Gatherings with Friends and Family: Twice a week    Attends Religious Services: Never    Marine scientist or Organizations: No    Attends Music therapist: Not on file    Marital Status: Married  Human resources officer Violence: Not on file    Past Surgical History:  Procedure Laterality Date   BACK SURGERY  05/28/2006   COLONOSCOPY     LUMBAR LAMINECTOMY  06/22/2011   Procedure: MICRODISCECTOMY LUMBAR LAMINECTOMY;  Surgeon: Marybelle Killings, MD;  Location: [place] [facility];  Service: Orthopedics;  Laterality: Left;  Left L5-S1 Microdiscectomy   VASECTOMY     29    Family History  Problem Relation Age of Onset   Colon polyps Father    Colon cancer Neg Hx    Esophageal cancer Neg Hx    Rectal cancer Neg Hx    Stomach cancer Neg Hx     No Known Allergies  Current Outpatient Medications on File Prior to Visit  Medication Sig Dispense Refill   gabapentin (NEURONTIN) 600 MG tablet Take 600 mg by mouth 3 (three) times daily.     levothyroxine (SYNTHROID) 112 MCG tablet TAKE 1 TABLET (112 MCG TOTAL) BY MOUTH DAILY BEFORE BREAKFAST. SCHEDULE APPT FOR FUTURE REFILLS 30 tablet 0   sertraline (ZOLOFT) 100 MG tablet Take 1.5 tablets (150 mg total) by mouth daily. 135 tablet 1   traZODone (DESYREL) 100 MG tablet Take 1-2 tablets (100-200 mg total) by mouth at bedtime as needed for sleep. 180 tablet 1   zaleplon (SONATA) 10 MG capsule TAKE 1 CAPSULE BY MOUTH AT BEDTIME AS NEEDED , AND MAY REPEAT 1 FOR MIDNOCTURNAL AWAKENING AS LONG AS THERE ARE 3 HOURS LEFT TO SLEEP. 30 capsule 0   No current facility-administered medications on file prior to visit.    BP 110/60   Pulse 67   Temp 98 F (36.7 C) (Oral)   Ht '5\' 10"'$  (1.778 m)   Wt (!) 332 lb  (150.6 kg)   SpO2 95%   BMI 47.64 kg/m       Objective:   Physical Exam Vitals and nursing note reviewed.  Constitutional:      General: He is not in acute distress.    Appearance: Normal appearance. He is well-developed. He is obese.  HENT:     Head: Normocephalic and atraumatic.     Right Ear: Tympanic membrane, ear canal and external ear normal. There is no impacted cerumen.     Left Ear: Tympanic membrane, ear canal and external ear normal. There is no impacted cerumen.     Nose: Nose normal. No congestion or rhinorrhea.  Mouth/Throat:     Mouth: Mucous membranes are moist.     Pharynx: Oropharynx is clear. No oropharyngeal exudate or posterior oropharyngeal erythema.  Eyes:     General:        Right eye: No discharge.        Left eye: No discharge.     Extraocular Movements: Extraocular movements intact.     Conjunctiva/sclera: Conjunctivae normal.     Pupils: Pupils are equal, round, and reactive to light.  Neck:     Vascular: No carotid bruit.     Trachea: No tracheal deviation.  Cardiovascular:     Rate and Rhythm: Normal rate and regular rhythm.     Pulses: Normal pulses.     Heart sounds: Normal heart sounds. No murmur heard.    No friction rub. No gallop.  Pulmonary:     Effort: Pulmonary effort is normal. No respiratory distress.     Breath sounds: Normal breath sounds. No stridor. No wheezing, rhonchi or rales.  Chest:     Chest wall: No tenderness.  Abdominal:     General: Bowel sounds are normal. There is no distension.     Palpations: Abdomen is soft. There is no mass.     Tenderness: There is no abdominal tenderness. There is no right CVA tenderness, left CVA tenderness, guarding or rebound.     Hernia: No hernia is present.  Musculoskeletal:        General: No swelling, tenderness, deformity or signs of injury. Normal range of motion.     Right lower leg: No edema.     Left lower leg: No edema.  Lymphadenopathy:     Cervical: No cervical  adenopathy.  Skin:    General: Skin is warm and dry.     Capillary Refill: Capillary refill takes less than 2 seconds.     Coloration: Skin is not jaundiced or pale.     Findings: No bruising, erythema, lesion or rash.  Neurological:     General: No focal deficit present.     Mental Status: He is alert and oriented to person, place, and time.     Cranial Nerves: No cranial nerve deficit.     Sensory: No sensory deficit.     Motor: No weakness.     Coordination: Coordination normal.     Gait: Gait normal.     Deep Tendon Reflexes: Reflexes normal.  Psychiatric:        Mood and Affect: Mood normal.        Behavior: Behavior normal.        Thought Content: Thought content normal.        Judgment: Judgment normal.        Assessment & Plan:  1. Encounter for general adult medical examination with abnormal findings - Work on weight loss measures - Follow up in one year or sooner if needed - CBC with Differential/Platelet; Future - Comprehensive metabolic panel; Future - Lipid panel; Future - TSH; Future - Hemoglobin A1c; Future - Hemoglobin A1c - TSH - Lipid panel - Comprehensive metabolic panel - CBC with Differential/Platelet  2. Primary insomnia - Per psychiatry   3. Prostate cancer screening  - PSA; Future - PSA  4. Acquired hypothyroidism - Consider increase in synthroid  - CBC with Differential/Platelet; Future - Comprehensive metabolic panel; Future - Lipid panel; Future - TSH; Future - Hemoglobin A1c; Future - Hemoglobin A1c - TSH - Lipid panel - Comprehensive metabolic panel - CBC with Differential/Platelet  5.  Anxiety and depression - Per Psychiatry   6. Class 3 obesity ([place]) - He will check with his insurance company to see if Mancel Parsons is covered  - CBC with Differential/Platelet; Future - Comprehensive metabolic panel; Future - Lipid panel; Future - TSH; Future - Hemoglobin A1c; Future - Hemoglobin A1c - TSH - Lipid panel - Comprehensive  metabolic panel - CBC with Differential/Platelet  7. Chronic bilateral low back pain without sciatica - Pain free currently.   8. Chronic pain of both knees - Per orthopedics  9. Need for immunization against influenza  - Flu Vaccine QUAD 12moIM (Fluarix, Fluzone & Alfiuria Quad PF)   CDorothyann Peng NP

## 2022-02-06 NOTE — Patient Instructions (Signed)
It was great seeing you today   We will follow up with you regarding your lab work   Please let me know if you need anything   Let me know if your insurance covers [facility]

## 2022-02-07 ENCOUNTER — Encounter: Payer: Self-pay | Admitting: Adult Health

## 2022-02-08 NOTE — Telephone Encounter (Signed)
FYI

## 2022-02-28 ENCOUNTER — Telehealth: Payer: Self-pay | Admitting: Physician Assistant

## 2022-02-28 NOTE — Telephone Encounter (Signed)
Pt has rx at pharmacy 

## 2022-02-28 NOTE — Telephone Encounter (Signed)
Patient lvm requesting a refill on the Sertraline. A follow up appointment is scheduled on 10/17.  Contact information # [phone]

## 2022-03-02 ENCOUNTER — Other Ambulatory Visit: Payer: Self-pay | Admitting: Adult Health

## 2022-03-05 ENCOUNTER — Other Ambulatory Visit: Payer: Self-pay | Admitting: Physician Assistant

## 2022-03-05 ENCOUNTER — Ambulatory Visit: Payer: BC Managed Care – PPO | Admitting: Physician Assistant

## 2022-03-13 ENCOUNTER — Encounter: Payer: Self-pay | Admitting: Physician Assistant

## 2022-03-13 ENCOUNTER — Ambulatory Visit: Payer: BC Managed Care – PPO | Admitting: Physician Assistant

## 2022-03-13 DIAGNOSIS — F3341 Major depressive disorder, recurrent, in partial remission: Secondary | ICD-10-CM

## 2022-03-13 DIAGNOSIS — F411 Generalized anxiety disorder: Secondary | ICD-10-CM | POA: Diagnosis not present

## 2022-03-13 DIAGNOSIS — G4700 Insomnia, unspecified: Secondary | ICD-10-CM

## 2022-03-13 MED ORDER — SERTRALINE HCL 100 MG PO TABS: 200.0000 mg | ORAL_TABLET | Freq: Every day | ORAL | 0 refills | Status: DC

## 2022-03-13 NOTE — Progress Notes (Signed)
[facility]  Patient ID: Miguel Bean,  MRN: [id number]  PCP: Dorothyann Peng, NP  Date of Evaluation: 03/13/2022  Time spent:20 minutes  Chief Complaint:  Chief Complaint   Depression; Insomnia; Follow-up    HISTORY/CURRENT STATUS: HPI  For 6 wk med check.  Zoloft was increased at the last visit.  He has not noticed any difference at all.  "You have got to understand that I am under a tremendous amount of stress."  Helps take care of his elderly mother.  Still working. "It's just a lot."  Not having panic attacks, just gets overwhelmed at times.  He does take trazodone every night which is helpful.  He also has Quarry manager which he uses sometimes.  Patient is able to enjoy some things, but with his care for his mom and work he does not have a lot of time to do anything for himself.  Energy and motivation are fair to good depending on the day.  Work is going well.   No extreme sadness, tearfulness, or feelings of hopelessness.   ADLs and personal hygiene are normal.   Denies any changes in concentration, making decisions, or remembering things.  Appetite has not changed.  Weight is stable.  Denies suicidal or homicidal thoughts.  Patient denies increased energy with decreased need for sleep, increased talkativeness, racing thoughts, impulsivity or risky behaviors, increased spending, increased libido, grandiosity, increased irritability or anger, paranoia, or hallucinations.  Individual Medical History/ Review of Systems: Changes? :No      Past medications for mental health diagnoses include: Zoloft, Melatonin, Trazodone  Allergies: Patient has no known allergies.  Current Medications:  Current Outpatient Medications:    gabapentin (NEURONTIN) 600 MG tablet, Take 600 mg by mouth 3 (three) times daily., Disp: , Rfl:    levothyroxine (SYNTHROID) 112 MCG tablet, Take 1 tablet (112 mcg total) by mouth daily before breakfast., Disp: 90 tablet, Rfl: 0   sertraline (ZOLOFT) 100 MG  tablet, TAKE 1 TABLET BY MOUTH EVERY DAY (Patient taking differently: Take 150 mg by mouth daily.), Disp: 90 tablet, Rfl: 0   traZODone (DESYREL) 100 MG tablet, Take 1-2 tablets (100-200 mg total) by mouth at bedtime as needed for sleep., Disp: 180 tablet, Rfl: 1   zaleplon (SONATA) 10 MG capsule, TAKE 1 CAPSULE BY MOUTH AT BEDTIME AS NEEDED , AND MAY REPEAT 1 FOR MIDNOCTURNAL AWAKENING AS LONG AS THERE ARE 3 HOURS LEFT TO SLEEP., Disp: 30 capsule, Rfl: 0 Medication Side Effects: none  Family Medical/ Social History: Changes?  No  MENTAL HEALTH EXAM:  There were no vitals taken for this visit.There is no height or weight on file to calculate BMI.  General Appearance: Casual, Neat, Well Groomed and Obese  Eye Contact:  Good  Speech:  Clear and Coherent and Normal Rate  Volume:  Normal  Mood:  Euthymic  Affect:  Appropriate  Thought Process:  Goal Directed and Descriptions of Associations: Circumstantial  Orientation:  Full (Time, Place, and Person)  Thought Content: Logical   Suicidal Thoughts:  No  Homicidal Thoughts:  No  Memory:  WNL  Judgement:  Good  Insight:  Good  Psychomotor Activity:  Normal  Concentration:  Concentration: Good and Attention Span: Good  Recall:  Good  Fund of Knowledge: Good  Language: Good  Assets:  Desire for Improvement Financial Resources/Insurance Housing Transportation Vocational/Educational  ADL's:  Intact  Cognition: WNL  Prognosis:  Good   DIAGNOSES:    ICD-10-CM   1. Generalized anxiety disorder  F41.1     2. Recurrent major depressive disorder, in partial remission ([place])  F33.41     3. Insomnia, unspecified type  G47.00       Receiving Psychotherapy: No   RECOMMENDATIONS:  PDMP reviewed.  Gabapentin filled 03/06/2022.  Sonata filled 01/04/2022.   I provided 20 minutes of face to face time during this encounter, including time spent before and after the visit in records review, medical decision making, counseling pertinent to  today's visit, and charting.   We discussed the Zoloft, I recommend increasing it to the usual max dose, and if he is not getting benefit from that will either change to a different SSRI, switch to an SNRI, or add BuSpar.  He is completely fine with increasing the Zoloft first.  Increase Zoloft 100 mg to 2 daily. Continue trazodone 100 mg, 1-2 p.o. nightly as needed sleep. Continue Sonata 10 mg, 1 p.o. nightly as needed and may repeat 1 for mid nocturnal awakening as needed as long as he has 3 hours left to sleep. Recommend counseling.  Return in 4 weeks.   Donnal Moat, PA-C

## 2022-04-24 ENCOUNTER — Encounter: Payer: Self-pay | Admitting: Physician Assistant

## 2022-04-24 ENCOUNTER — Ambulatory Visit: Payer: BC Managed Care – PPO | Admitting: Physician Assistant

## 2022-04-24 DIAGNOSIS — F411 Generalized anxiety disorder: Secondary | ICD-10-CM | POA: Diagnosis not present

## 2022-04-24 DIAGNOSIS — F3341 Major depressive disorder, recurrent, in partial remission: Secondary | ICD-10-CM

## 2022-04-24 DIAGNOSIS — G4700 Insomnia, unspecified: Secondary | ICD-10-CM

## 2022-04-24 MED ORDER — SERTRALINE HCL 100 MG PO TABS: 200.0000 mg | ORAL_TABLET | Freq: Every day | ORAL | 1 refills | Status: DC

## 2022-04-24 NOTE — Progress Notes (Signed)
[facility]  Patient ID: Miguel Bean,  MRN: [id number]  PCP: Dorothyann Peng, NP  Date of Evaluation: 04/24/2022  Time spent:20 minutes  Chief Complaint:  Chief Complaint   Depression; Insomnia; Follow-up    HISTORY/CURRENT STATUS: HPI  For 6 wk med check.  Doing well since increasing the Zoloft.  He is able to enjoy things, went to [facility] over the weekend and had a good time.   Energy and motivation are good.  Work is going well.   No extreme sadness, tearfulness, or feelings of hopelessness.  Sleeps well most of the time. ADLs and personal hygiene are normal.   Denies any changes in concentration, making decisions, or remembering things.  Appetite has not changed.  Weight is stable.  Anxiety isn't an issue.  Does still have a lot of stress helping take care of his elderly mother who lives alone but he is handling it as best as he can.  Denies suicidal or homicidal thoughts.  Patient denies increased energy with decreased need for sleep, increased talkativeness, racing thoughts, impulsivity or risky behaviors, increased spending, increased libido, grandiosity, increased irritability or anger, paranoia, or hallucinations.  Denies dizziness, syncope, seizures, numbness, tingling, tremor, tics, unsteady gait, slurred speech, confusion. Denies muscle or joint pain, stiffness, or dystonia.  Individual Medical History/ Review of Systems: Changes? :No      Past medications for mental health diagnoses include: Zoloft, Melatonin, Trazodone  Allergies: Patient has no known allergies.  Current Medications:  Current Outpatient Medications:    gabapentin (NEURONTIN) 600 MG tablet, Take 600 mg by mouth 3 (three) times daily., Disp: , Rfl:    levothyroxine (SYNTHROID) 112 MCG tablet, Take 1 tablet (112 mcg total) by mouth daily before breakfast., Disp: 90 tablet, Rfl: 0   traZODone (DESYREL) 100 MG tablet, Take 1-2 tablets (100-200 mg total) by mouth at bedtime as needed for  sleep., Disp: 180 tablet, Rfl: 1   zaleplon (SONATA) 10 MG capsule, TAKE 1 CAPSULE BY MOUTH AT BEDTIME AS NEEDED , AND MAY REPEAT 1 FOR MIDNOCTURNAL AWAKENING AS LONG AS THERE ARE 3 HOURS LEFT TO SLEEP., Disp: 30 capsule, Rfl: 0   sertraline (ZOLOFT) 100 MG tablet, Take 2 tablets (200 mg total) by mouth daily., Disp: 180 tablet, Rfl: 1 Medication Side Effects: none  Family Medical/ Social History: Changes?  No  MENTAL HEALTH EXAM:  There were no vitals taken for this visit.There is no height or weight on file to calculate BMI.  General Appearance: Casual, Neat, Well Groomed and Obese  Eye Contact:  Good  Speech:  Clear and Coherent and Normal Rate  Volume:  Normal  Mood:  Euthymic  Affect:  Appropriate  Thought Process:  Goal Directed and Descriptions of Associations: Intact  Orientation:  Full (Time, Place, and Person)  Thought Content: Logical   Suicidal Thoughts:  No  Homicidal Thoughts:  No  Memory:  WNL  Judgement:  Good  Insight:  Good  Psychomotor Activity:  Normal  Concentration:  Concentration: Good and Attention Span: Good  Recall:  Good  Fund of Knowledge: Good  Language: Good  Assets:  Desire for Improvement Financial Resources/Insurance Housing Transportation Vocational/Educational  ADL's:  Intact  Cognition: WNL  Prognosis:  Good   DIAGNOSES:    ICD-10-CM   1. Recurrent major depressive disorder, in partial remission ([place])  F33.41     2. Generalized anxiety disorder  F41.1     3. Insomnia, unspecified type  G47.00  Receiving Psychotherapy: No   RECOMMENDATIONS:  PDMP reviewed.  Gabapentin filled 04/02/2022.  Sonata filled 01/04/2022.   I provided 20 minutes of face to face time during this encounter, including time spent before and after the visit in records review, medical decision making, counseling pertinent to today's visit, and charting.   I am glad to see him doing better.  No change needed.    Continue gabapentin 600 mg, 1 p.o. 3 times  daily. Continue Zoloft 100 mg, 2 p.o. daily. Continue trazodone 100 mg, 1-2 p.o. nightly as needed sleep. Continue Sonata 10 mg, 1 p.o. nightly as needed and may repeat 1 for mid nocturnal awakening as needed as long as he has 3 hours left to sleep. Return in 6 months.  Donnal Moat, PA-C

## 2022-05-12 ENCOUNTER — Telehealth: Payer: BC Managed Care – PPO | Admitting: Nurse Practitioner

## 2022-05-12 DIAGNOSIS — Z20828 Contact with and (suspected) exposure to other viral communicable diseases: Secondary | ICD-10-CM | POA: Diagnosis not present

## 2022-05-12 MED ORDER — OSELTAMIVIR PHOSPHATE 75 MG PO CAPS: 75.0000 mg | ORAL_CAPSULE | Freq: Every day | ORAL | 0 refills | Status: DC

## 2022-05-12 NOTE — Progress Notes (Signed)
E visit for Flu like symptoms   We are sorry that you are not feeling well.  Here is how we plan to help! Based on what you have shared with me it looks like you may have possible exposure to a virus that causes influenza.  Influenza or "the flu" is   an infection caused by a respiratory virus. The flu virus is highly contagious and persons who did not receive their yearly flu vaccination may "catch" the flu from close contact.  We have anti-viral medications to treat the viruses that cause this infection. They are not a "cure" and only shorten the course of the infection. These prescriptions are most effective when they are given within the first 2 days of "flu" symptoms. Antiviral medication are indicated if you have a high risk of complications from the flu. You should  also consider an antiviral medication if you are in close contact with someone who is at risk. These medications can help patients avoid complications from the flu  but have side effects that you should know. Possible side effects from Tamiflu or oseltamivir include nausea, vomiting, diarrhea, dizziness, headaches, eye redness, sleep problems or other respiratory symptoms. You should not take Tamiflu if you have an allergy to oseltamivir or any to the ingredients in Tamiflu.  Based upon your symptoms and potential risk factors I have prescribed Oseltamivir (Tamiflu).  It has been sent to your designated pharmacy.  You will take one 75 mg capsule orally twice a day for the next 5 days.  ANYONE WHO HAS FLU SYMPTOMS SHOULD: Stay home. The flu is highly contagious and going out or to work exposes others! Be sure to drink plenty of fluids. Water is fine as well as fruit juices, sodas and electrolyte beverages. You may want to stay away from caffeine or alcohol. If you are nauseated, try taking small sips of liquids. How do you know if you are getting enough fluid? Your urine should be a pale yellow or almost colorless. Get rest. Taking  a steamy shower or using a humidifier may help nasal congestion and ease sore throat pain. Using a saline nasal spray works much the same way. Cough drops, hard candies and sore throat lozenges may ease your cough. Line up a caregiver. Have someone check on you regularly.   GET HELP RIGHT AWAY IF: You cannot keep down liquids or your medications. You become short of breath Your fell like you are going to pass out or loose consciousness. Your symptoms persist after you have completed your treatment plan MAKE SURE YOU  Understand these instructions. Will watch your condition. Will get help right away if you are not doing well or get worse.  Your e-visit answers were reviewed by a board certified advanced clinical practitioner to complete your personal care plan.  Depending on the condition, your plan could have included both over the counter or prescription medications.  If there is a problem please reply  once you have received a response from your provider.  Your safety is important to us.  If you have drug allergies check your prescription carefully.    You can use MyChart to ask questions about today's visit, request a non-urgent call back, or ask for a work or school excuse for 24 hours related to this e-Visit. If it has been greater than 24 hours you will need to follow up with your provider, or enter a new e-Visit to address those concerns.  You will get an e-mail in the   next two days asking about your experience.  I hope that your e-visit has been valuable and will speed your recovery. Thank you for using e-visits.   Mary-Margaret Hassell Done, FNP   5-10 minutes spent reviewing and documenting in chart.

## 2022-06-01 ENCOUNTER — Other Ambulatory Visit: Payer: Self-pay | Admitting: Adult Health

## 2022-06-01 DIAGNOSIS — E039 Hypothyroidism, unspecified: Secondary | ICD-10-CM

## 2022-06-01 NOTE — Telephone Encounter (Signed)
Ok to fill. Per labs pt TSH was elevated.

## 2022-06-01 NOTE — Telephone Encounter (Signed)
Pt has been scheduled for lab appt.

## 2022-06-05 ENCOUNTER — Other Ambulatory Visit: Payer: Self-pay | Admitting: Adult Health

## 2022-06-05 ENCOUNTER — Other Ambulatory Visit (INDEPENDENT_AMBULATORY_CARE_PROVIDER_SITE_OTHER): Payer: BC Managed Care – PPO

## 2022-06-05 DIAGNOSIS — E039 Hypothyroidism, unspecified: Secondary | ICD-10-CM

## 2022-06-05 LAB — TSH: TSH: 7.3 u[IU]/mL — ABNORMAL HIGH (ref 0.35–5.50)

## 2022-06-05 MED ORDER — LEVOTHYROXINE SODIUM 125 MCG PO TABS: 125.0000 ug | ORAL_TABLET | Freq: Every day | ORAL | 0 refills | Status: DC

## 2022-06-27 ENCOUNTER — Other Ambulatory Visit: Payer: Self-pay | Admitting: Adult Health

## 2022-06-27 DIAGNOSIS — E039 Hypothyroidism, unspecified: Secondary | ICD-10-CM

## 2022-07-03 ENCOUNTER — Other Ambulatory Visit: Payer: BC Managed Care – PPO

## 2022-07-04 ENCOUNTER — Other Ambulatory Visit (INDEPENDENT_AMBULATORY_CARE_PROVIDER_SITE_OTHER): Payer: BC Managed Care – PPO

## 2022-07-04 DIAGNOSIS — E039 Hypothyroidism, unspecified: Secondary | ICD-10-CM

## 2022-07-04 LAB — TSH: TSH: 4.87 u[IU]/mL (ref 0.35–5.50)

## 2022-09-10 ENCOUNTER — Encounter: Payer: Self-pay | Admitting: *Deleted

## 2022-09-28 ENCOUNTER — Ambulatory Visit: Payer: BC Managed Care – PPO | Admitting: Adult Health

## 2022-09-28 ENCOUNTER — Ambulatory Visit (INDEPENDENT_AMBULATORY_CARE_PROVIDER_SITE_OTHER): Payer: BC Managed Care – PPO

## 2022-09-28 VITALS — BP 100/70 | HR 75 | Temp 98.1°F | Wt 330.0 lb

## 2022-09-28 DIAGNOSIS — M79671 Pain in right foot: Secondary | ICD-10-CM

## 2022-09-28 DIAGNOSIS — M7989 Other specified soft tissue disorders: Secondary | ICD-10-CM | POA: Diagnosis not present

## 2022-09-28 NOTE — Progress Notes (Signed)
Subjective:    Patient ID: Miguel Bean, male    DOB: 06-05-1960, 62 y.o.   MRN: [id number]  HPI 62 year old male who  has a past medical history of Anxiety and depression, Back pain, Insomnia, PONV (postoperative nausea and vomiting), and Thyroid disease.  He presents to the office today for an acute issue. He reports pain in his right foot/heel. Most painful when walking. Pain has been present for a few months. Not getting and worse or any better.   NSAIDS did not work.   Review of Systems See HPI   Past Medical History:  Diagnosis Date   Anxiety and depression    Back pain    Insomnia    PONV (postoperative nausea and vomiting)    Thyroid disease     Social History   Socioeconomic History   Marital status: Married    Spouse name: Not on file   Number of children: Not on file   Years of education: Not on file   Highest education level: 12th grade  Occupational History   Not on file  Tobacco Use   Smoking status: Never   Smokeless tobacco: Never  Vaping Use   Vaping Use: Never used  Substance and Sexual Activity   Alcohol use: No   Drug use: No   Sexual activity: Yes  Other Topics Concern   Not on file  Social History Narrative   Not on file   Social Determinants of Health   Financial Resource Strain: Low Risk  (09/28/2022)   Overall Financial Resource Strain (CARDIA)    Difficulty of Paying Living Expenses: Not hard at all  Food Insecurity: No Food Insecurity (09/28/2022)   Hunger Vital Sign    Worried About Running Out of Food in the Last Year: Never true    Ran Out of Food in the Last Year: Never true  Transportation Needs: No Transportation Needs (09/28/2022)   PRAPARE - Administrator, Civil Service (Medical): No    Lack of Transportation (Non-Medical): No  Physical Activity: Insufficiently Active (09/28/2022)   Exercise Vital Sign    Days of Exercise per Week: 2 days    Minutes of Exercise per Session: 20 min  Stress: Stress Concern Present  (09/28/2022)   [employer] of Occupational Health - Occupational Stress Questionnaire    Feeling of Stress : To some extent  Social Connections: Moderately Integrated (09/28/2022)   Social Connection and Isolation Panel [NHANES]    Frequency of Communication with Friends and Family: More than three times a week    Frequency of Social Gatherings with Friends and Family: Once a week    Attends Religious Services: 1 to 4 times per year    Active Member of [employer] or Organizations: No    Attends Engineer, structural: Not on file    Marital Status: Married  Catering manager Violence: Not on file    Past Surgical History:  Procedure Laterality Date   BACK SURGERY  05/28/2006   COLONOSCOPY     LUMBAR LAMINECTOMY  06/22/2011   Procedure: MICRODISCECTOMY LUMBAR LAMINECTOMY;  Surgeon: Eldred Manges, MD;  Location: [facility] OR;  Service: Orthopedics;  Laterality: Left;  Left L5-S1 Microdiscectomy   VASECTOMY     96    Family History  Problem Relation Age of Onset   Colon polyps Father    Colon cancer Neg Hx    Esophageal cancer Neg Hx    Rectal cancer Neg Hx  Stomach cancer Neg Hx     No Known Allergies  Current Outpatient Medications on File Prior to Visit  Medication Sig Dispense Refill   gabapentin (NEURONTIN) 600 MG tablet Take 600 mg by mouth 3 (three) times daily.     levothyroxine (SYNTHROID) 125 MCG tablet TAKE 1 TABLET BY MOUTH DAILY BEFORE BREAKFAST. 90 tablet 1   sertraline (ZOLOFT) 100 MG tablet Take 2 tablets (200 mg total) by mouth daily. 180 tablet 1   traZODone (DESYREL) 100 MG tablet Take 1-2 tablets (100-200 mg total) by mouth at bedtime as needed for sleep. 180 tablet 1   zaleplon (SONATA) 10 MG capsule TAKE 1 CAPSULE BY MOUTH AT BEDTIME AS NEEDED , AND MAY REPEAT 1 FOR MIDNOCTURNAL AWAKENING AS LONG AS THERE ARE 3 HOURS LEFT TO SLEEP. 30 capsule 0   No current facility-administered medications on file prior to visit.    BP 100/70   Pulse 75   Temp 98.1 F  (36.7 C) (Oral)   Wt (!) 330 lb (149.7 kg)   SpO2 96%   BMI 47.35 kg/m       Objective:   Physical Exam Cardiovascular:     Pulses:          Dorsalis pedis pulses are 2+ on the left side.       Posterior tibial pulses are 2+ on the left side.  Musculoskeletal:     Left foot: Normal range of motion. No bunion.       Feet:  Feet:     Left foot:     Skin integrity: Skin integrity normal.        Assessment & Plan:  1. Pain of right heel - Ligament/tendonitis vs bursitis - DG Foot Complete Right; Future  Shirline Frees, NP

## 2022-09-29 ENCOUNTER — Other Ambulatory Visit: Payer: Self-pay | Admitting: Physician Assistant

## 2022-10-03 ENCOUNTER — Telehealth: Payer: Self-pay | Admitting: Adult Health

## 2022-10-03 NOTE — Telephone Encounter (Signed)
Opened in error

## 2022-10-10 ENCOUNTER — Other Ambulatory Visit: Payer: Self-pay | Admitting: Physician Assistant

## 2022-10-17 ENCOUNTER — Encounter: Payer: Self-pay | Admitting: Adult Health

## 2022-10-17 ENCOUNTER — Ambulatory Visit: Payer: BC Managed Care – PPO | Admitting: Adult Health

## 2022-10-17 VITALS — BP 120/70 | Temp 97.7°F | Wt 324.0 lb

## 2022-10-17 NOTE — Progress Notes (Addendum)
Subjective:    Patient ID: Miguel Bean, male    DOB: 07/01/1960, 62 y.o.   MRN: [id number]  HPI 62 year old male  has a past medical history of Anxiety and depression, Back pain, Insomnia, PONV (postoperative nausea and vomiting), and Thyroid disease.  He presents to the office today for obesity and weight loss management. He has checked with his insurance and they will not cover GLP-1 therapy. In the past he used phentermine and reports doing well but he did plateau and had insomnia. He is thinking about bariatric surgery, more specifically gastric balloon   Review of Systems See HPI   Past Medical History:  Diagnosis Date   Anxiety and depression    Back pain    Insomnia    PONV (postoperative nausea and vomiting)    Thyroid disease     Social History   Socioeconomic History   Marital status: Married    Spouse name: Not on file   Number of children: Not on file   Years of education: Not on file   Highest education level: 12th grade  Occupational History   Not on file  Tobacco Use   Smoking status: Never   Smokeless tobacco: Never  Vaping Use   Vaping Use: Never used  Substance and Sexual Activity   Alcohol use: No   Drug use: No   Sexual activity: Yes  Other Topics Concern   Not on file  Social History Narrative   Not on file   Social Determinants of Health   Financial Resource Strain: Low Risk  (09/28/2022)   Overall Financial Resource Strain (CARDIA)    Difficulty of Paying Living Expenses: Not hard at all  Food Insecurity: No Food Insecurity (09/28/2022)   Hunger Vital Sign    Worried About Running Out of Food in the Last Year: Never true    Ran Out of Food in the Last Year: Never true  Transportation Needs: No Transportation Needs (09/28/2022)   PRAPARE - Administrator, Civil Service (Medical): No    Lack of Transportation (Non-Medical): No  Physical Activity: Insufficiently Active (09/28/2022)   Exercise Vital Sign    Days of Exercise per Week:  2 days    Minutes of Exercise per Session: 20 min  Stress: Stress Concern Present (09/28/2022)   [employer] of Occupational Health - Occupational Stress Questionnaire    Feeling of Stress : To some extent  Social Connections: Moderately Integrated (09/28/2022)   Social Connection and Isolation Panel [NHANES]    Frequency of Communication with Friends and Family: More than three times a week    Frequency of Social Gatherings with Friends and Family: Once a week    Attends Religious Services: 1 to 4 times per year    Active Member of [employer] or Organizations: No    Attends Engineer, structural: Not on file    Marital Status: Married  Catering manager Violence: Not on file    Past Surgical History:  Procedure Laterality Date   BACK SURGERY  05/28/2006   COLONOSCOPY     LUMBAR LAMINECTOMY  06/22/2011   Procedure: MICRODISCECTOMY LUMBAR LAMINECTOMY;  Surgeon: Eldred Manges, MD;  Location: [facility] OR;  Service: Orthopedics;  Laterality: Left;  Left L5-S1 Microdiscectomy   VASECTOMY     96    Family History  Problem Relation Age of Onset   Colon polyps Father    Colon cancer Neg Hx    Esophageal cancer Neg Hx  Rectal cancer Neg Hx    Stomach cancer Neg Hx     No Known Allergies  Current Outpatient Medications on File Prior to Visit  Medication Sig Dispense Refill   gabapentin (NEURONTIN) 600 MG tablet Take 600 mg by mouth 3 (three) times daily.     levothyroxine (SYNTHROID) 125 MCG tablet TAKE 1 TABLET BY MOUTH DAILY BEFORE BREAKFAST. 90 tablet 1   sertraline (ZOLOFT) 100 MG tablet TAKE 2 TABLETS BY MOUTH EVERY DAY 180 tablet 0   traZODone (DESYREL) 100 MG tablet Take 1-2 tablets (100-200 mg total) by mouth at bedtime as needed for sleep. 180 tablet 1   zaleplon (SONATA) 10 MG capsule TAKE 1 CAPSULE BY MOUTH AT BEDTIME AS NEEDED , AND MAY REPEAT 1 FOR MIDNOCTURNAL AWAKENING AS LONG AS THERE ARE 3 HOURS LEFT TO SLEEP. 30 capsule 0   No current facility-administered  medications on file prior to visit.    BP 120/70   Temp 97.7 F (36.5 C) (Oral)   Wt (!) 324 lb (147 kg)   BMI 46.49 kg/m       Objective:   Physical Exam Vitals and nursing note reviewed.  Constitutional:      Appearance: Normal appearance.  Cardiovascular:     Rate and Rhythm: Normal rate and regular rhythm.     Pulses: Normal pulses.     Heart sounds: Normal heart sounds.  Pulmonary:     Effort: Pulmonary effort is normal.     Breath sounds: Normal breath sounds.  Musculoskeletal:        General: Normal range of motion.  Skin:    General: Skin is warm and dry.  Neurological:     General: No focal deficit present.     Mental Status: He is alert and oriented to person, place, and time.  Psychiatric:        Mood and Affect: Mood normal.        Behavior: Behavior normal.        Thought Content: Thought content normal.        Judgment: Judgment normal.       Assessment & Plan:  1. Class 3 obesity (HCC) - He is going to look into options for bariatric surgery. I think this would be the best option for him as he has a lot of chronic knee and back pain which makes it hard for him to exercise  Shirline Frees, NP  Time spent with patient today was 32 minutes which consisted of chart review, discussing obesity, work up, treatment answering questions and documentation.

## 2022-10-18 ENCOUNTER — Encounter: Payer: Self-pay | Admitting: Adult Health

## 2022-10-18 ENCOUNTER — Other Ambulatory Visit: Payer: Self-pay | Admitting: Adult Health

## 2022-10-18 ENCOUNTER — Ambulatory Visit: Payer: BC Managed Care – PPO | Admitting: Adult Health

## 2022-10-18 DIAGNOSIS — M1712 Unilateral primary osteoarthritis, left knee: Secondary | ICD-10-CM | POA: Diagnosis not present

## 2022-10-18 MED ORDER — PHENTERMINE HCL 15 MG PO CAPS: 15.0000 mg | ORAL_CAPSULE | ORAL | 0 refills | Status: DC

## 2022-10-18 NOTE — Telephone Encounter (Signed)
Please advise 

## 2022-10-23 ENCOUNTER — Encounter: Payer: Self-pay | Admitting: Physician Assistant

## 2022-10-23 ENCOUNTER — Ambulatory Visit: Payer: BC Managed Care – PPO | Admitting: Physician Assistant

## 2022-10-23 DIAGNOSIS — F3341 Major depressive disorder, recurrent, in partial remission: Secondary | ICD-10-CM

## 2022-10-23 DIAGNOSIS — G4700 Insomnia, unspecified: Secondary | ICD-10-CM

## 2022-10-23 DIAGNOSIS — F411 Generalized anxiety disorder: Secondary | ICD-10-CM | POA: Diagnosis not present

## 2022-10-23 DIAGNOSIS — Z6841 Body Mass Index (BMI) 40.0 and over, adult: Secondary | ICD-10-CM

## 2022-10-23 MED ORDER — ZALEPLON 10 MG PO CAPS: 10.0000 mg | ORAL_CAPSULE | Freq: Every day | ORAL | 5 refills | Status: DC

## 2022-10-23 NOTE — Progress Notes (Signed)
[facility] Check  Patient ID: Miguel Bean,  MRN: [id number]  PCP: Shirline Frees, NP  Date of Evaluation: 10/23/2022 Time spent:20 minutes  Chief Complaint:  Chief Complaint   Anxiety; Depression; Follow-up    HISTORY/CURRENT STATUS: HPI  for 6 month med check.  Feels like meds for mental health are helping. His Mom had to go in an assisted living back in the winter. That was stressful, but it's over with now, they sold her house, and she's content where she's at, that helps him.   Patient is able to enjoy things.  Energy and motivation are good.  Work is going well.  No extreme sadness, tearfulness, or feelings of hopelessness.  Sleeps well with the Sonata.  It works better than the trazodone does and he is not as hesitant to take it now.  A good night sleep is very important to him.  ADLs and personal hygiene are normal.   Denies any changes in concentration, making decisions, or remembering things.  Appetite has not changed.  Weight is stable. He started on Phentermine today.  Denies suicidal or homicidal thoughts.  Patient denies increased energy with decreased need for sleep, increased talkativeness, racing thoughts, impulsivity or risky behaviors, increased spending, increased libido, grandiosity, increased irritability or anger, paranoia, or hallucinations.  Denies dizziness, syncope, seizures, numbness, tingling, tremor, tics, unsteady gait, slurred speech, confusion. Denies muscle or joint pain, stiffness, or dystonia.  Individual Medical History/ Review of Systems: Changes? :Yes   started on Phentermine today.  He will be losing wt so he can have knee replacement next year.    Past medications for mental health diagnoses include: Zoloft, Melatonin, Trazodone  Allergies: Patient has no known allergies.  Current Medications:  Current Outpatient Medications:    gabapentin (NEURONTIN) 600 MG tablet, Take 600 mg by mouth 2 (two) times daily., Disp: , Rfl:     levothyroxine (SYNTHROID) 125 MCG tablet, TAKE 1 TABLET BY MOUTH DAILY BEFORE BREAKFAST., Disp: 90 tablet, Rfl: 1   phentermine 15 MG capsule, Take 1 capsule (15 mg total) by mouth every morning., Disp: 30 capsule, Rfl: 0   sertraline (ZOLOFT) 100 MG tablet, TAKE 2 TABLETS BY MOUTH EVERY DAY, Disp: 180 tablet, Rfl: 0   traZODone (DESYREL) 100 MG tablet, Take 1-2 tablets (100-200 mg total) by mouth at bedtime as needed for sleep., Disp: 180 tablet, Rfl: 1   zaleplon (SONATA) 10 MG capsule, Take 1 capsule (10 mg total) by mouth at bedtime. May take another dose in the middle of the night if needed for mid-nocturnal awakening as long as he has 3-4 hours left to sleep., Disp: 60 capsule, Rfl: 5 Medication Side Effects: none  Family Medical/ Social History: Changes?  No  MENTAL HEALTH EXAM:  There were no vitals taken for this visit.There is no height or weight on file to calculate BMI.  General Appearance: Casual, Well Groomed, and morbidly obese  Eye Contact:  Good  Speech:  Clear and Coherent and Normal Rate  Volume:  Normal  Mood:  Euthymic  Affect:  Appropriate  Thought Process:  Goal Directed and Descriptions of Associations: Intact  Orientation:  Full (Time, Place, and Person)  Thought Content: Logical   Suicidal Thoughts:  No  Homicidal Thoughts:  No  Memory:  WNL  Judgement:  Good  Insight:  Good  Psychomotor Activity:  Normal  Concentration:  Concentration: Good and Attention Span: Good  Recall:  Good  Fund of Knowledge: Good  Language: Good  Assets:  Desire for Improvement Financial Resources/Insurance Housing Transportation Vocational/Educational  ADL's:  Intact  Cognition: WNL  Prognosis:  Good   DIAGNOSES:    ICD-10-CM   1. Recurrent major depressive disorder, in partial remission (HCC)  F33.41     2. Generalized anxiety disorder  F41.1     3. Insomnia, unspecified type  G47.00     4. Morbid obesity with BMI of 40.0-44.9, adult (HCC)  E66.01    Z68.41       Receiving Psychotherapy: No   RECOMMENDATIONS:  PDMP reviewed.  Gabapentin filled 09/19/2022.  Sonata filled 10/15/2022.  Phentermine filled 10/18/2022. I provided 20 minutes of face to face time during this encounter, including time spent before and after the visit in records review, medical decision making, counseling pertinent to today's visit, and charting.   I am glad to see him doing well.  No changes need to be made.  Continue gabapentin 600 mg, 1 p.o. 2 times daily. Continue Zoloft 100 mg, 2 p.o. daily. Continue trazodone 100 mg, 1-2 p.o. nightly as needed sleep. Continue Sonata 10 mg, 1 p.o. nightly as needed and may repeat 1 for mid nocturnal awakening as needed as long as he has 3 hours left to sleep. Return in 6 months.  Melony Overly, PA-C

## 2022-11-20 ENCOUNTER — Telehealth: Payer: Self-pay | Admitting: Physician Assistant

## 2022-11-20 ENCOUNTER — Other Ambulatory Visit: Payer: Self-pay

## 2022-11-20 MED ORDER — ZALEPLON 10 MG PO CAPS: 10.0000 mg | ORAL_CAPSULE | Freq: Every day | ORAL | 5 refills | Status: DC

## 2022-11-20 NOTE — Telephone Encounter (Signed)
Pended.

## 2022-11-20 NOTE — Telephone Encounter (Signed)
Cylis LM at 3:10 to report that the pharmacy has a incorrect prescription with wrong dose.  Insurance won't cover it. Please call to discuss.  Appt 12/2

## 2022-11-21 ENCOUNTER — Encounter: Payer: Self-pay | Admitting: Adult Health

## 2022-11-21 ENCOUNTER — Telehealth: Payer: BC Managed Care – PPO | Admitting: Adult Health

## 2022-11-21 VITALS — Ht 70.0 in | Wt 314.0 lb

## 2022-11-21 MED ORDER — PHENTERMINE HCL 15 MG PO CAPS: 15.0000 mg | ORAL_CAPSULE | ORAL | 2 refills | Status: DC

## 2022-11-21 NOTE — Progress Notes (Signed)
Virtual Visit via Video Note  I connected with Miguel Bean  on 11/21/22 at 11:30 AM EDT by a video enabled telemedicine application and verified that I am speaking with the correct person using two identifiers.  Location patient: home Location provider:work or home office Persons participating in the virtual visit: patient, provider  I discussed the limitations of evaluation and management by telemedicine and the availability of in person appointments. The patient expressed understanding and agreed to proceed.   HPI: He is being evaluated today for 1 month follow-up regarding obesity and weight loss management.  His insurance does not cover GLP-1 therapy.  In the past he did use phentermine and reported doing well with this medication but only ended up plateauing and may have had some insomnia.  We last spoke he was thinking about bariatric surgery but wanted to restart on phentermine to see if this would help him lose some weight in the short-term.  Decided to start him on phentermine 15 mg daily.   Today he reports that he has not had any side effects of the phentermine. The phentermine is curbing his appetite. He has also been working on diet. .   Wt Readings from Last 3 Encounters:  11/21/22 (!) 314 lb (142.4 kg)  10/17/22 (!) 324 lb (147 kg)  09/28/22 (!) 330 lb (149.7 kg)    ROS: See pertinent positives and negatives per HPI.  Past Medical History:  Diagnosis Date   Anxiety and depression    Back pain    Insomnia    PONV (postoperative nausea and vomiting)    Thyroid disease     Past Surgical History:  Procedure Laterality Date   BACK SURGERY  05/28/2006   COLONOSCOPY     LUMBAR LAMINECTOMY  06/22/2011   Procedure: MICRODISCECTOMY LUMBAR LAMINECTOMY;  Surgeon: Eldred Manges, MD;  Location: [facility] OR;  Service: Orthopedics;  Laterality: Left;  Left L5-S1 Microdiscectomy   VASECTOMY     96    Family History  Problem Relation Age of Onset   Colon polyps Father    Colon  cancer Neg Hx    Esophageal cancer Neg Hx    Rectal cancer Neg Hx    Stomach cancer Neg Hx        Current Outpatient Medications:    gabapentin (NEURONTIN) 600 MG tablet, Take 600 mg by mouth 2 (two) times daily., Disp: , Rfl:    levothyroxine (SYNTHROID) 125 MCG tablet, TAKE 1 TABLET BY MOUTH DAILY BEFORE BREAKFAST., Disp: 90 tablet, Rfl: 1   phentermine 15 MG capsule, Take 1 capsule (15 mg total) by mouth every morning., Disp: 30 capsule, Rfl: 0   sertraline (ZOLOFT) 100 MG tablet, TAKE 2 TABLETS BY MOUTH EVERY DAY, Disp: 180 tablet, Rfl: 0   traZODone (DESYREL) 100 MG tablet, Take 1-2 tablets (100-200 mg total) by mouth at bedtime as needed for sleep., Disp: 180 tablet, Rfl: 1   zaleplon (SONATA) 10 MG capsule, Take 1 capsule (10 mg total) by mouth at bedtime. May take another dose in the middle of the night if needed for mid-nocturnal awakening as long as he has 3-4 hours left to sleep., Disp: 30 capsule, Rfl: 5  EXAM:  VITALS per patient if applicable:  GENERAL: alert, oriented, appears well and in no acute distress  HEENT: atraumatic, conjunttiva clear, no obvious abnormalities on inspection of external nose and ears  NECK: normal movements of the head and neck  LUNGS: on inspection no signs of respiratory distress, breathing rate  appears normal, no obvious gross SOB, gasping or wheezing  CV: no obvious cyanosis  MS: moves all visible extremities without noticeable abnormality  PSYCH/NEURO: pleasant and cooperative, no obvious depression or anxiety, speech and thought processing grossly intact  ASSESSMENT AND PLAN:  Discussed the following assessment and plan:  1. Class 3 obesity (HCC) - He has lost about 10 pounds over the last month. Will keep him on Phentemrine 15 mg daily.  - Follow up in 3 months  - phentermine 15 MG capsule; Take 1 capsule (15 mg total) by mouth every morning.  Dispense: 30 capsule; Refill: 2     I discussed the assessment and treatment plan  with the patient. The patient was provided an opportunity to ask questions and all were answered. The patient agreed with the plan and demonstrated an understanding of the instructions.   The patient was advised to call back or seek an in-person evaluation if the symptoms worsen or if the condition fails to improve as anticipated.   Shirline Frees, NP

## 2022-11-23 ENCOUNTER — Telehealth: Payer: Self-pay | Admitting: Physician Assistant

## 2022-11-23 NOTE — Telephone Encounter (Signed)
Pt LVM @ 2:03p.  He said [facility] is holding the script for [employer].  It's the 3rd time.  He is wanting someone to find out what the issue is and call him back.  Next app 12/2

## 2022-11-23 NOTE — Telephone Encounter (Signed)
The pharmacist at this location is difficult to deal with.  He is due for a RF and she said she would fill today. Notified patient.

## 2022-12-11 ENCOUNTER — Telehealth: Payer: Self-pay

## 2022-12-17 ENCOUNTER — Telehealth: Payer: Self-pay | Admitting: Physician Assistant

## 2022-12-17 NOTE — Telephone Encounter (Signed)
[employer] approved ZALEPLON 10mg  capsule for pt from 12/14/2022-12/14/2023

## 2022-12-20 NOTE — Telephone Encounter (Signed)
Prior Authorization submitted and approved for ZALEPLON 10 MG CAPS, effective 12/14/2022-12/14/2023, PA Case: [id number] with [facility]

## 2022-12-24 ENCOUNTER — Other Ambulatory Visit: Payer: Self-pay | Admitting: Adult Health

## 2022-12-24 DIAGNOSIS — E039 Hypothyroidism, unspecified: Secondary | ICD-10-CM

## 2022-12-26 ENCOUNTER — Other Ambulatory Visit: Payer: Self-pay | Admitting: Physician Assistant

## 2023-01-24 ENCOUNTER — Other Ambulatory Visit: Payer: Self-pay | Admitting: Physician Assistant

## 2023-02-08 ENCOUNTER — Encounter: Payer: Self-pay | Admitting: Adult Health

## 2023-02-08 ENCOUNTER — Ambulatory Visit: Payer: BC Managed Care – PPO | Admitting: Adult Health

## 2023-02-08 VITALS — BP 120/60 | HR 76 | Temp 97.4°F | Ht 70.0 in | Wt 321.0 lb

## 2023-02-08 DIAGNOSIS — M25561 Pain in right knee: Secondary | ICD-10-CM | POA: Diagnosis not present

## 2023-02-08 DIAGNOSIS — G8929 Other chronic pain: Secondary | ICD-10-CM

## 2023-02-08 MED ORDER — METHYLPREDNISOLONE ACETATE 80 MG/ML IJ SUSP
80.0000 mg | Freq: Once | INTRAMUSCULAR | Status: AC
Start: 2023-02-08 — End: 2023-02-08
  Administered 2023-02-08: 80 mg via INTRAMUSCULAR

## 2023-02-08 NOTE — Progress Notes (Signed)
Subjective:    Patient ID: Miguel Bean, male    DOB: [birth year], 62 y.o.   MRN: [id number]  Knee Pain    62 year old male who  has a past medical history of Anxiety and depression, Back pain, Insomnia, PONV (postoperative nausea and vomiting), and Thyroid disease.  He presents to the office today for chronic right knee pain.  Does have known tricompartmental osteoarthritis in his right knee.  Has been seen by [facility] recently and advised that he needs a knee replacement but that he needs to lose weight first.  In December he is going to have gastric surgery done to help with weight loss.  Today he is wondering if he can have a steroid injection into his right knee as he has had these in the past and they worked well for him.   Review of Systems See HPI   Past Medical History:  Diagnosis Date   Anxiety and depression    Back pain    Insomnia    PONV (postoperative nausea and vomiting)    Thyroid disease     Social History   Socioeconomic History   Marital status: Married    Spouse name: Not on file   Number of children: Not on file   Years of education: Not on file   Highest education level: 12th grade  Occupational History   Not on file  Tobacco Use   Smoking status: Never   Smokeless tobacco: Never  Vaping Use   Vaping status: Never Used  Substance and Sexual Activity   Alcohol use: No   Drug use: No   Sexual activity: Yes  Other Topics Concern   Not on file  Social History Narrative   Not on file   Social Determinants of Health   Financial Resource Strain: Low Risk  (09/28/2022)   Overall Financial Resource Strain (CARDIA)    Difficulty of Paying Living Expenses: Not hard at all  Food Insecurity: No Food Insecurity (09/28/2022)   Hunger Vital Sign    Worried About Running Out of Food in the Last Year: Never true    Ran Out of Food in the Last Year: Never true  Transportation Needs: No Transportation Needs (09/28/2022)   PRAPARE - Scientist, research (physical sciences) (Medical): No    Lack of Transportation (Non-Medical): No  Physical Activity: Insufficiently Active (09/28/2022)   Exercise Vital Sign    Days of Exercise per Week: 2 days    Minutes of Exercise per Session: 20 min  Stress: Stress Concern Present (09/28/2022)   [employer] of Occupational Health - Occupational Stress Questionnaire    Feeling of Stress : To some extent  Social Connections: Moderately Integrated (09/28/2022)   Social Connection and Isolation Panel [NHANES]    Frequency of Communication with Friends and Family: More than three times a week    Frequency of Social Gatherings with Friends and Family: Once a week    Attends Religious Services: 1 to 4 times per year    Active Member of [employer] or Organizations: No    Attends Banker Meetings: Not on file    Marital Status: Married  Intimate Partner Violence: Unknown (08/30/2021)   Received from [facility], [facility]   HITS    Physically Hurt: Not on file    Insult or Talk Down To: Not on file    Threaten Physical Harm: Not on file    Scream or Curse: Not on file  Past Surgical History:  Procedure Laterality Date   BACK SURGERY  05/28/2006   COLONOSCOPY     LUMBAR LAMINECTOMY  06/22/2011   Procedure: MICRODISCECTOMY LUMBAR LAMINECTOMY;  Surgeon: Eldred Manges, MD;  Location: [facility] OR;  Service: Orthopedics;  Laterality: Left;  Left L5-S1 Microdiscectomy   VASECTOMY     96    Family History  Problem Relation Age of Onset   Colon polyps Father    Colon cancer Neg Hx    Esophageal cancer Neg Hx    Rectal cancer Neg Hx    Stomach cancer Neg Hx     No Known Allergies  Current Outpatient Medications on File Prior to Visit  Medication Sig Dispense Refill   gabapentin (NEURONTIN) 600 MG tablet Take 600 mg by mouth 2 (two) times daily.     levothyroxine (SYNTHROID) 125 MCG tablet TAKE 1 TABLET BY MOUTH EVERY DAY BEFORE BREAKFAST 90 tablet 1   phentermine 15 MG capsule Take 1 capsule (15  mg total) by mouth every morning. 30 capsule 2   sertraline (ZOLOFT) 100 MG tablet TAKE 2 TABLETS BY MOUTH EVERY DAY 180 tablet 0   traZODone (DESYREL) 100 MG tablet TAKE 1-2 TABLETS (100-200 MG TOTAL) BY MOUTH AT BEDTIME AS NEEDED FOR SLEEP. 180 tablet 1   zaleplon (SONATA) 10 MG capsule Take 1 capsule (10 mg total) by mouth at bedtime. May take another dose in the middle of the night if needed for mid-nocturnal awakening as long as he has 3-4 hours left to sleep. 30 capsule 5   No current facility-administered medications on file prior to visit.    BP 120/60   Pulse 76   Temp (!) 97.4 F (36.3 C) (Oral)   Ht 5\' 10"  (1.778 m)   Wt (!) 321 lb (145.6 kg)   SpO2 96%   BMI 46.06 kg/m       Objective:   Physical Exam Vitals and nursing note reviewed.  Constitutional:      Appearance: Normal appearance. He is obese.  Musculoskeletal:        General: Tenderness present. No swelling. Normal range of motion.  Neurological:     General: No focal deficit present.     Mental Status: He is alert and oriented to person, place, and time.  Psychiatric:        Mood and Affect: Mood normal.        Behavior: Behavior normal.        Thought Content: Thought content normal.        Judgment: Judgment normal.        Assessment & Plan:  1. Chronic pain of right knee Discussed risks and benefits of corticosteroid injection and patient consented.  After prepping skin with betadine, injected 80 mg depomedrol and 2 cc of plain xylocaine with 22 gauge one and one half inch needle using anterolateral approach and pt tolerated well.  - methylPREDNISolone acetate (DEPO-MEDROL) injection 80 mg  Shirline Frees, NP

## 2023-02-12 ENCOUNTER — Ambulatory Visit: Payer: BC Managed Care – PPO | Admitting: Adult Health

## 2023-02-12 ENCOUNTER — Encounter: Payer: Self-pay | Admitting: Adult Health

## 2023-02-12 VITALS — BP 110/80 | HR 73 | Temp 98.1°F | Ht 70.0 in | Wt 321.0 lb

## 2023-02-12 DIAGNOSIS — M25562 Pain in left knee: Secondary | ICD-10-CM | POA: Diagnosis not present

## 2023-02-12 DIAGNOSIS — G8929 Other chronic pain: Secondary | ICD-10-CM | POA: Diagnosis not present

## 2023-02-12 MED ORDER — METHYLPREDNISOLONE ACETATE 80 MG/ML IJ SUSP
80.0000 mg | Freq: Once | INTRAMUSCULAR | Status: AC
Start: 2023-02-12 — End: 2023-02-12
  Administered 2023-02-12: 80 mg via INTRA_ARTICULAR

## 2023-02-12 NOTE — Progress Notes (Signed)
Subjective:    Patient ID: Miguel Bean, male    DOB: Jul 08, 1960, 62 y.o.   MRN: [id number]  Knee Pain    62 year old male who  has a past medical history of Anxiety and depression, Back pain, Insomnia, PONV (postoperative nausea and vomiting), and Thyroid disease.  He presents to the office today for chronic left knee pain. He had a steroid injection in his right knee late last week and reports that this helped alleviate a lot of the pain from arthritis that he was feeling. He would like to do the left knee today. He is [place] at the [place] of this week and would like to walk around.  He is going to have bariatric surgery in December and then start the process for knee replacements.    Review of Systems See HPI   Past Medical History:  Diagnosis Date   Anxiety and depression    Back pain    Insomnia    PONV (postoperative nausea and vomiting)    Thyroid disease     Social History   Socioeconomic History   Marital status: Married    Spouse name: Not on file   Number of children: Not on file   Years of education: Not on file   Highest education level: 12th grade  Occupational History   Not on file  Tobacco Use   Smoking status: Never   Smokeless tobacco: Never  Vaping Use   Vaping status: Never Used  Substance and Sexual Activity   Alcohol use: No   Drug use: No   Sexual activity: Yes  Other Topics Concern   Not on file  Social History Narrative   Not on file   Social Determinants of Health   Financial Resource Strain: Low Risk  (09/28/2022)   Overall Financial Resource Strain (CARDIA)    Difficulty of Paying Living Expenses: Not hard at all  Food Insecurity: No Food Insecurity (09/28/2022)   Hunger Vital Sign    Worried About Running Out of Food in the Last Year: Never true    Ran Out of Food in the Last Year: Never true  Transportation Needs: No Transportation Needs (09/28/2022)   PRAPARE - Administrator, Civil Service  (Medical): No    Lack of Transportation (Non-Medical): No  Physical Activity: Insufficiently Active (09/28/2022)   Exercise Vital Sign    Days of Exercise per Week: 2 days    Minutes of Exercise per Session: 20 min  Stress: Stress Concern Present (09/28/2022)   [employer] of Occupational Health - Occupational Stress Questionnaire    Feeling of Stress : To some extent  Social Connections: Moderately Integrated (09/28/2022)   Social Connection and Isolation Panel [NHANES]    Frequency of Communication with Friends and Family: More than three times a week    Frequency of Social Gatherings with Friends and Family: Once a week    Attends Religious Services: 1 to 4 times per year    Active Member of [employer] or Organizations: No    Attends Banker Meetings: Not on file    Marital Status: Married  Intimate Partner Violence: Unknown (08/30/2021)   Received from [facility], [facility]   HITS    Physically Hurt: Not on file    Insult or Talk Down To: Not on file    Threaten Physical Harm: Not on file    Scream or Curse: Not on file  Past Surgical History:  Procedure Laterality Date   BACK SURGERY  05/28/2006   COLONOSCOPY     LUMBAR LAMINECTOMY  06/22/2011   Procedure: MICRODISCECTOMY LUMBAR LAMINECTOMY;  Surgeon: Eldred Manges, MD;  Location: [facility] OR;  Service: Orthopedics;  Laterality: Left;  Left L5-S1 Microdiscectomy   VASECTOMY     96    Family History  Problem Relation Age of Onset   Colon polyps Father    Colon cancer Neg Hx    Esophageal cancer Neg Hx    Rectal cancer Neg Hx    Stomach cancer Neg Hx     No Known Allergies  Current Outpatient Medications on File Prior to Visit  Medication Sig Dispense Refill   gabapentin (NEURONTIN) 600 MG tablet Take 600 mg by mouth 2 (two) times daily.     levothyroxine (SYNTHROID) 125 MCG tablet TAKE 1 TABLET BY MOUTH EVERY DAY BEFORE BREAKFAST 90 tablet 1   phentermine 15 MG capsule Take 1 capsule (15 mg total) by  mouth every morning. 30 capsule 2   sertraline (ZOLOFT) 100 MG tablet TAKE 2 TABLETS BY MOUTH EVERY DAY 180 tablet 0   traZODone (DESYREL) 100 MG tablet TAKE 1-2 TABLETS (100-200 MG TOTAL) BY MOUTH AT BEDTIME AS NEEDED FOR SLEEP. 180 tablet 1   zaleplon (SONATA) 10 MG capsule Take 1 capsule (10 mg total) by mouth at bedtime. May take another dose in the middle of the night if needed for mid-nocturnal awakening as long as he has 3-4 hours left to sleep. 30 capsule 5   No current facility-administered medications on file prior to visit.    BP 110/80   Pulse 73   Temp 98.1 F (36.7 C) (Oral)   Ht 5\' 10"  (1.778 m)   Wt (!) 321 lb (145.6 kg)   SpO2 97%   BMI 46.06 kg/m       Objective:   Physical Exam Vitals and nursing note reviewed.  Constitutional:      Appearance: Normal appearance.  Cardiovascular:     Rate and Rhythm: Normal rate and regular rhythm.     Pulses: Normal pulses.     Heart sounds: Normal heart sounds.  Pulmonary:     Effort: Pulmonary effort is normal.     Breath sounds: Normal breath sounds.  Musculoskeletal:        General: Tenderness present. No swelling or deformity. Normal range of motion.  Skin:    General: Skin is warm and dry.  Neurological:     General: No focal deficit present.     Mental Status: He is alert and oriented to person, place, and time.  Psychiatric:        Mood and Affect: Mood normal.        Behavior: Behavior normal.        Thought Content: Thought content normal.        Judgment: Judgment normal.       Assessment & Plan:  1. Chronic pain of left knee Discussed risks and benefits of corticosteroid injection and patient consented.  After prepping skin with betadine, injected 80 mg depomedrol and 2 cc of plain xylocaine with 22 gauge one and one half inch needle using anterolateral approach and pt tolerated well.  - methylPREDNISolone acetate (DEPO-MEDROL) injection 80 mg - Follow up as needed  Shirline Frees, NP

## 2023-03-06 DIAGNOSIS — M5416 Radiculopathy, lumbar region: Secondary | ICD-10-CM | POA: Diagnosis not present

## 2023-03-14 DIAGNOSIS — M5416 Radiculopathy, lumbar region: Secondary | ICD-10-CM | POA: Diagnosis not present

## 2023-03-26 ENCOUNTER — Other Ambulatory Visit: Payer: Self-pay | Admitting: Physician Assistant

## 2023-04-15 DIAGNOSIS — M5416 Radiculopathy, lumbar region: Secondary | ICD-10-CM | POA: Diagnosis not present

## 2023-04-23 DIAGNOSIS — M5416 Radiculopathy, lumbar region: Secondary | ICD-10-CM | POA: Diagnosis not present

## 2023-04-28 HISTORY — PX: OTHER SURGICAL HISTORY: SHX169

## 2023-04-29 ENCOUNTER — Ambulatory Visit (INDEPENDENT_AMBULATORY_CARE_PROVIDER_SITE_OTHER): Payer: Self-pay | Admitting: Physician Assistant

## 2023-04-29 DIAGNOSIS — Z91199 Patient's noncompliance with other medical treatment and regimen due to unspecified reason: Secondary | ICD-10-CM

## 2023-04-29 NOTE — Progress Notes (Signed)
No show

## 2023-05-10 ENCOUNTER — Telehealth: Payer: BC Managed Care – PPO | Admitting: Physician Assistant

## 2023-05-10 ENCOUNTER — Encounter: Payer: Self-pay | Admitting: Physician Assistant

## 2023-05-10 DIAGNOSIS — F4321 Adjustment disorder with depressed mood: Secondary | ICD-10-CM

## 2023-05-10 DIAGNOSIS — F411 Generalized anxiety disorder: Secondary | ICD-10-CM | POA: Diagnosis not present

## 2023-05-10 DIAGNOSIS — G4700 Insomnia, unspecified: Secondary | ICD-10-CM | POA: Diagnosis not present

## 2023-05-10 DIAGNOSIS — Z6841 Body Mass Index (BMI) 40.0 and over, adult: Secondary | ICD-10-CM

## 2023-05-10 DIAGNOSIS — F3341 Major depressive disorder, recurrent, in partial remission: Secondary | ICD-10-CM

## 2023-05-10 MED ORDER — ZALEPLON 10 MG PO CAPS: 10.0000 mg | ORAL_CAPSULE | Freq: Every day | ORAL | 5 refills | Status: DC

## 2023-05-10 NOTE — Progress Notes (Signed)
[facility]  Patient ID: Miguel Bean,  MRN: [id number]  PCP: Shirline Frees, NP  Date of Evaluation: 05/10/2023 Time spent:20 minutes  Chief Complaint:  Chief Complaint   Anxiety; Depression; Insomnia; Follow-up   Virtual Visit via Telehealth  I connected with patient by a video enabled telemedicine application, with their informed consent, and verified patient privacy and that I am speaking with the correct person using two identifiers.  I am private, in my office and the patient is at home.  I discussed the limitations, risks, security and privacy concerns of performing an evaluation and management service by video and the availability of in person appointments. I also discussed with the patient that there may be a patient responsible charge related to this service. The patient expressed understanding and agreed to proceed.   I discussed the assessment and treatment plan with the patient. The patient was provided an opportunity to ask questions and all were answered. The patient agreed with the plan and demonstrated an understanding of the instructions.   The patient was advised to call back or seek an in-person evaluation if the symptoms worsen or if the condition fails to improve as anticipated.  I provided 20  minutes of non-face-to-face time during this encounter.  HISTORY/CURRENT STATUS: HPI  for 6 month med check.  His Mom died several months ago. He's doing ok with it even though he misses her. Patient is able to enjoy things.  Energy and motivation are good.  Work is going well.   No extreme sadness, tearfulness, or feelings of hopelessness.  Sleeps well with the Trazodone and Sonata. ADLs and personal hygiene are normal.   Denies any changes in concentration, making decisions, or remembering things.  Appetite has not changed.  Weight is stable.  No c/o anxiety.  Denies suicidal or homicidal thoughts.  Patient denies increased energy with decreased need for  sleep, increased talkativeness, racing thoughts, impulsivity or risky behaviors, increased spending, increased libido, grandiosity, increased irritability or anger, paranoia, or hallucinations.  Denies dizziness, syncope, seizures, numbness, tingling, tremor, tics, unsteady gait, slurred speech, confusion. Denies muscle or joint pain, stiffness, or dystonia.  Individual Medical History/ Review of Systems: Changes? :No       Past medications for mental health diagnoses include: Zoloft, Melatonin, Trazodone  Allergies: Patient has no known allergies.  Current Medications:  Current Outpatient Medications:    levothyroxine (SYNTHROID) 125 MCG tablet, TAKE 1 TABLET BY MOUTH EVERY DAY BEFORE BREAKFAST, Disp: 90 tablet, Rfl: 1   pregabalin (LYRICA) 75 MG capsule, Take by mouth., Disp: , Rfl:    sertraline (ZOLOFT) 100 MG tablet, TAKE 2 TABLETS BY MOUTH EVERY DAY, Disp: 180 tablet, Rfl: 0   traZODone (DESYREL) 100 MG tablet, TAKE 1-2 TABLETS (100-200 MG TOTAL) BY MOUTH AT BEDTIME AS NEEDED FOR SLEEP., Disp: 180 tablet, Rfl: 1   gabapentin (NEURONTIN) 600 MG tablet, Take 600 mg by mouth 2 (two) times daily. (Patient not taking: Reported on 05/10/2023), Disp: , Rfl:    phentermine 15 MG capsule, Take 1 capsule (15 mg total) by mouth every morning. (Patient not taking: Reported on 05/10/2023), Disp: 30 capsule, Rfl: 2   zaleplon (SONATA) 10 MG capsule, Take 1 capsule (10 mg total) by mouth at bedtime. May take another dose in the middle of the night if needed for mid-nocturnal awakening as long as he has 3-4 hours left to sleep., Disp: 30 capsule, Rfl: 5 Medication Side Effects: none  Family Medical/ Social History: Changes?  Mom  died in August, CVA  MENTAL HEALTH EXAM:  There were no vitals taken for this visit.There is no height or weight on file to calculate BMI.  General Appearance: Casual, Well Groomed, and Obese  Eye Contact:  Good  Speech:  Clear and Coherent and Normal Rate  Volume:   Normal  Mood:  Euthymic  Affect:  Appropriate  Thought Process:  Goal Directed and Descriptions of Associations: Intact  Orientation:  Full (Time, Place, and Person)  Thought Content: Logical   Suicidal Thoughts:  No  Homicidal Thoughts:  No  Memory:  WNL  Judgement:  Good  Insight:  Good  Psychomotor Activity:  Normal  Concentration:  Concentration: Good and Attention Span: Good  Recall:  Good  Fund of Knowledge: Good  Language: Good  Assets:  Communication Skills Desire for Improvement Financial Resources/Insurance Housing Transportation Vocational/Educational  ADL's:  Intact  Cognition: WNL  Prognosis:  Good   DIAGNOSES:    ICD-10-CM   1. Recurrent major depressive disorder, in partial remission (HCC)  F33.41     2. Grief  F43.21     3. Generalized anxiety disorder  F41.1     4. Insomnia, unspecified type  G47.00     5. Morbid obesity with BMI of 40.0-44.9, adult (HCC)  E66.01    Z68.41       Receiving Psychotherapy: No   RECOMMENDATIONS:  PDMP reviewed.  Gabapentin filled 05/01/2023.  Sonata filled 04/08/2023.  Phentermine filled 01/24/2023. I provided 20 minutes of non-face-to-face time during this encounter, including time spent before and after the visit in records review, medical decision making, counseling pertinent to today's visit, and charting.   My sympathy in the loss of his Mom.   He is doing well so no med changes.   Continue Zoloft 100 mg, 2 p.o. daily. Continue trazodone 100 mg, 1-2 p.o. nightly as needed sleep. Continue Sonata 10 mg, 1 p.o. nightly as needed and may repeat 1 for mid nocturnal awakening as needed as long as he has 3 hours left to sleep. Return in 3-4 months.  Melony Overly, PA-C

## 2023-05-19 ENCOUNTER — Encounter: Payer: Self-pay | Admitting: Physician Assistant

## 2023-06-11 ENCOUNTER — Other Ambulatory Visit: Payer: Self-pay | Admitting: Physician Assistant

## 2023-06-12 NOTE — Telephone Encounter (Signed)
LF 12/11

## 2023-06-19 ENCOUNTER — Other Ambulatory Visit: Payer: Self-pay | Admitting: Adult Health

## 2023-06-19 DIAGNOSIS — E039 Hypothyroidism, unspecified: Secondary | ICD-10-CM

## 2023-06-26 ENCOUNTER — Other Ambulatory Visit: Payer: Self-pay | Admitting: Physician Assistant

## 2023-07-20 ENCOUNTER — Other Ambulatory Visit: Payer: Self-pay | Admitting: Physician Assistant

## 2023-08-16 ENCOUNTER — Other Ambulatory Visit: Payer: Self-pay | Admitting: Physician Assistant

## 2023-08-26 ENCOUNTER — Ambulatory Visit: Payer: BC Managed Care – PPO | Admitting: Physician Assistant

## 2023-08-26 ENCOUNTER — Encounter: Payer: Self-pay | Admitting: Physician Assistant

## 2023-08-26 DIAGNOSIS — F3341 Major depressive disorder, recurrent, in partial remission: Secondary | ICD-10-CM

## 2023-08-26 DIAGNOSIS — G4700 Insomnia, unspecified: Secondary | ICD-10-CM

## 2023-08-26 DIAGNOSIS — F411 Generalized anxiety disorder: Secondary | ICD-10-CM | POA: Diagnosis not present

## 2023-08-26 MED ORDER — SERTRALINE HCL 100 MG PO TABS: 200.0000 mg | ORAL_TABLET | Freq: Every day | ORAL | 11 refills | Status: DC

## 2023-08-26 NOTE — Progress Notes (Signed)
 [facility]  Patient ID: Miguel Bean,  MRN: [id number]  PCP: Shirline Frees, NP  Date of Evaluation: 08/26/2023 Time spent:20 minutes  Chief Complaint:  Chief Complaint   Depression; Insomnia; Anxiety; Follow-up    HISTORY/CURRENT STATUS: HPI  for 6 month med check.  Had gastric sleeve surgery. Is doing really well. Feels better about himself. Feels better physically too. Is sleeping well.   Patient is able to enjoy things.  Energy and motivation are good.  Work is going well.   No extreme sadness, tearfulness, or feelings of hopelessness. ADLs and personal hygiene are normal.   Denies any changes in concentration, making decisions, or remembering things. No c/o anxiety.  Denies suicidal or homicidal thoughts.  Patient denies increased energy with decreased need for sleep, increased talkativeness, racing thoughts, impulsivity or risky behaviors, increased spending, increased libido, grandiosity, increased irritability or anger, paranoia, or hallucinations.  Denies dizziness, syncope, seizures, numbness, tingling, tremor, tics, unsteady gait, slurred speech, confusion. Denies muscle or joint pain, stiffness, or dystonia.  Individual Medical History/ Review of Systems: Changes? :Yes   had gastric sleeve since LOV. Has lost about 45 # since then.      Past medications for mental health diagnoses include: Zoloft, Melatonin, Trazodone  Allergies: Patient has no known allergies.  Current Medications:  Current Outpatient Medications:    levothyroxine (SYNTHROID) 125 MCG tablet, TAKE 1 TABLET BY MOUTH EVERY DAY BEFORE BREAKFAST, Disp: 90 tablet, Rfl: 1   pregabalin (LYRICA) 75 MG capsule, Take by mouth., Disp: , Rfl:    traZODone (DESYREL) 100 MG tablet, TAKE 1-2 TABLETS (100-200 MG TOTAL) BY MOUTH AT BEDTIME AS NEEDED FOR SLEEP., Disp: 180 tablet, Rfl: 1   zaleplon (SONATA) 10 MG capsule, Take 1 capsule (10 mg total) by mouth at bedtime. May take another dose in the middle  of the night if needed for mid-nocturnal awakening as long as he has 3-4 hours left to sleep., Disp: 30 capsule, Rfl: 5   gabapentin (NEURONTIN) 600 MG tablet, Take 600 mg by mouth 2 (two) times daily. (Patient not taking: Reported on 05/10/2023), Disp: , Rfl:    phentermine 15 MG capsule, Take 1 capsule (15 mg total) by mouth every morning. (Patient not taking: Reported on 05/10/2023), Disp: 30 capsule, Rfl: 2   sertraline (ZOLOFT) 100 MG tablet, Take 2 tablets (200 mg total) by mouth daily., Disp: 60 tablet, Rfl: 11 Medication Side Effects: none  Family Medical/ Social History: Changes?   none  MENTAL HEALTH EXAM:  There were no vitals taken for this visit.There is no height or weight on file to calculate BMI.  General Appearance: Casual, Well Groomed, and Obese  Eye Contact:  Good  Speech:  Clear and Coherent and Normal Rate  Volume:  Normal  Mood:  Euthymic  Affect:  Appropriate  Thought Process:  Goal Directed and Descriptions of Associations: Intact  Orientation:  Full (Time, Place, and Person)  Thought Content: Logical   Suicidal Thoughts:  No  Homicidal Thoughts:  No  Memory:  WNL  Judgement:  Good  Insight:  Good  Psychomotor Activity:  Normal  Concentration:  Concentration: Good and Attention Span: Good  Recall:  Good  Fund of Knowledge: Good  Language: Good  Assets:  Communication Skills Desire for Improvement Financial Resources/Insurance Housing Resilience Transportation Vocational/Educational  ADL's:  Intact  Cognition: WNL  Prognosis:  Good   DIAGNOSES:    ICD-10-CM   1. Recurrent major depressive disorder, in partial remission (HCC)  F33.41  2. Generalized anxiety disorder  F41.1     3. Insomnia, unspecified type  G47.00      Receiving Psychotherapy: No   RECOMMENDATIONS:  PDMP reviewed.  Sonata filled 08/13/2023.  Lyrica filled 07/28/2023. I provided  20 minutes of face to face time during this encounter, including time spent before and after the  visit in records review, medical decision making, counseling pertinent to today's visit, and charting.   Doing well with current meds so no changes.   Continue Zoloft 100 mg, 2 p.o. daily. Continue trazodone 100 mg, 1-2 p.o. nightly as needed sleep. Continue Sonata 10 mg, 1 p.o. nightly as needed and may repeat 1 for mid nocturnal awakening as needed as long as he has 3 hours left to sleep. Return in 6 months.  Melony Overly, PA-C

## 2023-08-28 ENCOUNTER — Other Ambulatory Visit: Payer: Self-pay | Admitting: Physician Assistant

## 2023-09-03 ENCOUNTER — Encounter: Payer: Self-pay | Admitting: Adult Health

## 2023-09-03 ENCOUNTER — Ambulatory Visit (INDEPENDENT_AMBULATORY_CARE_PROVIDER_SITE_OTHER): Payer: BC Managed Care – PPO | Admitting: Adult Health

## 2023-09-03 VITALS — BP 100/68 | HR 53 | Temp 97.6°F | Ht 69.25 in | Wt 278.0 lb

## 2023-09-03 DIAGNOSIS — Z0000 Encounter for general adult medical examination without abnormal findings: Secondary | ICD-10-CM | POA: Diagnosis not present

## 2023-09-03 DIAGNOSIS — E66813 Obesity, class 3: Secondary | ICD-10-CM | POA: Diagnosis not present

## 2023-09-03 DIAGNOSIS — M25562 Pain in left knee: Secondary | ICD-10-CM

## 2023-09-03 DIAGNOSIS — G8929 Other chronic pain: Secondary | ICD-10-CM

## 2023-09-03 DIAGNOSIS — E039 Hypothyroidism, unspecified: Secondary | ICD-10-CM

## 2023-09-03 DIAGNOSIS — M545 Low back pain, unspecified: Secondary | ICD-10-CM

## 2023-09-03 DIAGNOSIS — F5101 Primary insomnia: Secondary | ICD-10-CM | POA: Diagnosis not present

## 2023-09-03 DIAGNOSIS — Z125 Encounter for screening for malignant neoplasm of prostate: Secondary | ICD-10-CM | POA: Diagnosis not present

## 2023-09-03 DIAGNOSIS — F32A Depression, unspecified: Secondary | ICD-10-CM

## 2023-09-03 DIAGNOSIS — F419 Anxiety disorder, unspecified: Secondary | ICD-10-CM

## 2023-09-03 DIAGNOSIS — M25561 Pain in right knee: Secondary | ICD-10-CM

## 2023-09-03 LAB — TSH: TSH: 2.25 u[IU]/mL (ref 0.35–5.50)

## 2023-09-03 LAB — COMPREHENSIVE METABOLIC PANEL WITH GFR
ALT: 17 U/L (ref 0–53)
AST: 25 U/L (ref 0–37)
Albumin: 4.3 g/dL (ref 3.5–5.2)
Alkaline Phosphatase: 92 U/L (ref 39–117)
BUN: 16 mg/dL (ref 6–23)
CO2: 27 meq/L (ref 19–32)
Calcium: 9.4 mg/dL (ref 8.4–10.5)
Chloride: 103 meq/L (ref 96–112)
Creatinine, Ser: 1.03 mg/dL (ref 0.40–1.50)
GFR: 77.58 mL/min (ref >60.00)
Glucose, Bld: 73 mg/dL (ref 70–99)
Potassium: 3.9 meq/L (ref 3.5–5.1)
Sodium: 142 meq/L (ref 135–145)
Total Bilirubin: 1 mg/dL (ref 0.2–1.2)
Total Protein: 6.5 g/dL (ref 6.0–8.3)

## 2023-09-03 LAB — HEMOGLOBIN A1C: Hgb A1c MFr Bld: 5 % (ref 4.6–6.5)

## 2023-09-03 LAB — PSA: PSA: 3 ng/mL (ref 0.10–4.00)

## 2023-09-03 LAB — CBC
HCT: 47.4 % (ref 39.0–52.0)
Hemoglobin: 15.9 g/dL (ref 13.0–17.0)
MCHC: 33.5 g/dL (ref 30.0–36.0)
MCV: 91.2 fl (ref 78.0–100.0)
Platelets: 164 10*3/uL (ref 150.0–400.0)
RBC: 5.2 Mil/uL (ref 4.22–5.81)
RDW: 13.6 % (ref 11.5–15.5)
WBC: 6.1 10*3/uL (ref 4.0–10.5)

## 2023-09-03 LAB — LIPID PANEL
Cholesterol: 170 mg/dL (ref 0–200)
HDL: 34.1 mg/dL — ABNORMAL LOW (ref >39.00)
LDL Cholesterol: 112 mg/dL — ABNORMAL HIGH (ref 0–99)
NonHDL: 135.58
Total CHOL/HDL Ratio: 5
Triglycerides: 116 mg/dL (ref 0.0–149.0)
VLDL: 23.2 mg/dL (ref 0.0–40.0)

## 2023-09-03 NOTE — Progress Notes (Signed)
 Subjective:    Patient ID: Miguel Bean Nurse Hanlon, male    DOB: 08/16/1960, 63 y.o.   MRN: [id number]  HPI Patient presents for yearly preventative medicine examination. He is a pleasant 63 year old male who  has a past medical history of Anxiety and depression, Back pain, Insomnia, PONV (postoperative nausea and vomiting), and Thyroid disease.  Insomnia-managed by behavioral health.  He is currently prescribed trazodone 100 to 200 mg nightly and Sonata 10 mg as needed. Reports that he continues to have trouble falling asleep.   Anxiety/depression-managed with Zoloft 150 mg daily.  He feels well controlled on this medication. Is going to try and wean himself off this year .  Hypothyroidism-managed with Synthroid 125 mcg daily. Lab Results  Component Value Date   TSH 4.87 07/04/2022   Morbid Obesity - he had Endoscopic Sleeve Gastroplasty done on December 30th 2024. He is down 47 pounds since the procedure. He is eating about 1800 calories a day of high protein and low carbs. He feels much better overall with the weight loss.  Wt Readings from Last 3 Encounters:  09/03/23 278 lb (126.1 kg)  02/12/23 (!) 321 lb (145.6 kg)  02/08/23 (!) 321 lb (145.6 kg)    Chronic Back Pain - managed by [facility] and has tried injections in the back which helped and is also on Pregabalin 75 mg BID.   Chronic Knee Pain - has less pain since starting Lyrica and weight loss. He is still considering knee replacement.   All immunizations and health maintenance protocols were reviewed with the patient and needed orders were placed.  Appropriate screening laboratory values were ordered for the patient including screening of hyperlipidemia, renal function and hepatic function. If indicated by BPH, a PSA was ordered.  Medication reconciliation,  past medical history, social history, problem list and allergies were reviewed in detail with the patient  Goals were established with regard to weight loss, exercise, and   diet in compliance with medications. He is walking at lunch and has been more mindful of his diet.   He is up to date on colon cancer screening    Review of Systems  Constitutional: Negative.   HENT: Negative.    Eyes: Negative.   Respiratory: Negative.    Cardiovascular: Negative.   Gastrointestinal: Negative.   Endocrine: Negative.   Genitourinary: Negative.   Musculoskeletal:  Positive for arthralgias.  Skin: Negative.   Allergic/Immunologic: Negative.   Neurological: Negative.   Hematological: Negative.   Psychiatric/Behavioral: Negative.    All other systems reviewed and are negative.  Past Medical History:  Diagnosis Date   Anxiety and depression    Back pain    Insomnia    PONV (postoperative nausea and vomiting)    Thyroid disease     Social History   Socioeconomic History   Marital status: Married    Spouse name: Not on file   Number of children: Not on file   Years of education: Not on file   Highest education level: Some college, no degree  Occupational History   Not on file  Tobacco Use   Smoking status: Never   Smokeless tobacco: Never  Vaping Use   Vaping status: Never Used  Substance and Sexual Activity   Alcohol use: No   Drug use: No   Sexual activity: Yes  Other Topics Concern   Not on file  Social History Narrative   Not on file   Social Drivers of Health   Financial  Resource Strain: Low Risk  (09/02/2023)   Overall Financial Resource Strain (CARDIA)    Difficulty of Paying Living Expenses: Not hard at all  Food Insecurity: No Food Insecurity (09/02/2023)   Hunger Vital Sign    Worried About Running Out of Food in the Last Year: Never true    Ran Out of Food in the Last Year: Never true  Transportation Needs: No Transportation Needs (09/02/2023)   PRAPARE - Administrator, Civil Service (Medical): No    Lack of Transportation (Non-Medical): No  Physical Activity: Insufficiently Active (09/02/2023)   Exercise Vital Sign     Days of Exercise per Week: 3 days    Minutes of Exercise per Session: 30 min  Stress: No Stress Concern Present (09/02/2023)   [employer] of Occupational Health - Occupational Stress Questionnaire    Feeling of Stress : Only a little  Social Connections: Socially Isolated (09/02/2023)   Social Connection and Isolation Panel [NHANES]    Frequency of Communication with Friends and Family: Once a week    Frequency of Social Gatherings with Friends and Family: Once a week    Attends Religious Services: Never    Database administrator or Organizations: No    Attends Engineer, structural: Not on file    Marital Status: Married  Intimate Partner Violence: Unknown (08/30/2021)   Received from [employer], [facility]   HITS    Physically Hurt: Not on file    Insult or Talk Down To: Not on file    Threaten Physical Harm: Not on file    Scream or Curse: Not on file    Past Surgical History:  Procedure Laterality Date   BACK SURGERY  05/28/2006   COLONOSCOPY     LUMBAR LAMINECTOMY  06/22/2011   Procedure: MICRODISCECTOMY LUMBAR LAMINECTOMY;  Surgeon: Eldred Manges, MD;  Location: [facility] OR;  Service: Orthopedics;  Laterality: Left;  Left L5-S1 Microdiscectomy   VASECTOMY     96    Family History  Problem Relation Age of Onset   Colon polyps Father    Colon cancer Neg Hx    Esophageal cancer Neg Hx    Rectal cancer Neg Hx    Stomach cancer Neg Hx     No Known Allergies  Current Outpatient Medications on File Prior to Visit  Medication Sig Dispense Refill   levothyroxine (SYNTHROID) 125 MCG tablet TAKE 1 TABLET BY MOUTH EVERY DAY BEFORE BREAKFAST 90 tablet 1   pregabalin (LYRICA) 75 MG capsule Take by mouth.     sertraline (ZOLOFT) 100 MG tablet Take 2 tablets (200 mg total) by mouth daily. 60 tablet 11   traZODone (DESYREL) 100 MG tablet TAKE 1-2 TABLETS (100-200 MG TOTAL) BY MOUTH AT BEDTIME AS NEEDED FOR SLEEP. 180 tablet 1   zaleplon (SONATA) 10 MG capsule Take 1  capsule (10 mg total) by mouth at bedtime. May take another dose in the middle of the night if needed for mid-nocturnal awakening as long as he has 3-4 hours left to sleep. 30 capsule 5   No current facility-administered medications on file prior to visit.    BP 100/68   Pulse (!) 53   Temp 97.6 F (36.4 C) (Oral)   Ht 5' 9.25" (1.759 m)   Wt 278 lb (126.1 kg)   SpO2 95%   BMI 40.76 kg/m       Objective:   Physical Exam Vitals and nursing note reviewed.  Constitutional:  General: He is not in acute distress.    Appearance: Normal appearance. He is not ill-appearing.  HENT:     Head: Normocephalic and atraumatic.     Right Ear: Tympanic membrane, ear canal and external ear normal. There is no impacted cerumen.     Left Ear: Tympanic membrane, ear canal and external ear normal. There is no impacted cerumen.     Nose: Nose normal. No congestion or rhinorrhea.     Mouth/Throat:     Mouth: Mucous membranes are moist.     Pharynx: Oropharynx is clear.  Eyes:     Extraocular Movements: Extraocular movements intact.     Conjunctiva/sclera: Conjunctivae normal.     Pupils: Pupils are equal, round, and reactive to light.  Neck:     Vascular: No carotid bruit.  Cardiovascular:     Rate and Rhythm: Normal rate and regular rhythm.     Pulses: Normal pulses.     Heart sounds: No murmur heard.    No friction rub. No gallop.  Pulmonary:     Effort: Pulmonary effort is normal.     Breath sounds: Normal breath sounds.  Abdominal:     General: Abdomen is flat. Bowel sounds are normal. There is no distension.     Palpations: Abdomen is soft. There is no mass.     Tenderness: There is no abdominal tenderness. There is no guarding or rebound.     Hernia: No hernia is present.  Musculoskeletal:        General: Normal range of motion.     Cervical back: Normal range of motion and neck supple.  Lymphadenopathy:     Cervical: No cervical adenopathy.  Skin:    General: Skin is warm  and dry.     Capillary Refill: Capillary refill takes less than 2 seconds.  Neurological:     General: No focal deficit present.     Mental Status: He is alert and oriented to person, place, and time.  Psychiatric:        Mood and Affect: Mood normal.        Behavior: Behavior normal.        Thought Content: Thought content normal.        Judgment: Judgment normal.        Assessment & Plan:  1. Routine general medical examination at a health care facility (Primary) Today patient counseled on age appropriate routine health concerns for screening and prevention, each reviewed and up to date or declined. Immunizations reviewed and up to date or declined. Labs ordered and reviewed. Risk factors for depression reviewed and negative. Hearing function and visual acuity are intact. ADLs screened and addressed as needed. Functional ability and level of safety reviewed and appropriate. Education, counseling and referrals performed based on assessed risks today. Patient provided with a copy of personalized plan for preventive services. - Follow up in one year or sooner if needed  2. Primary insomnia - Per psychiatry  - Lipid panel; Future - TSH; Future - CBC; Future - Comprehensive metabolic panel with GFR; Future  3. Acquired hypothyroidism - Consider dose change  - Lipid panel; Future - TSH; Future - CBC; Future - Comprehensive metabolic panel with GFR; Future  4. Class 3 obesity - Doing great with weight loss.  - Continue to eat healthy and exercise - Lipid panel; Future - TSH; Future - CBC; Future - Comprehensive metabolic panel with GFR; Future - Hemoglobin A1c; Future  5. Chronic bilateral low back pain  without sciatica - Per ortho=  6. Chronic pain of both knees - Per ortho   7. Prostate cancer screening - PSA; Future  8. Anxiety and depression - per psychiatry   Shirline Frees, NP

## 2023-09-03 NOTE — Patient Instructions (Signed)
 It was great seeing you today   We will follow up with you regarding your lab work   Please let me know if you need anything

## 2023-09-04 ENCOUNTER — Other Ambulatory Visit: Payer: Self-pay

## 2023-09-04 MED ORDER — ATORVASTATIN CALCIUM 10 MG PO TABS: 10.0000 mg | ORAL_TABLET | Freq: Every day | ORAL | 3 refills | Status: DC

## 2023-11-08 ENCOUNTER — Ambulatory Visit: Admitting: Adult Health

## 2023-11-08 ENCOUNTER — Encounter: Payer: Self-pay | Admitting: Adult Health

## 2023-11-08 VITALS — BP 100/62 | HR 55 | Temp 98.1°F | Ht 69.25 in | Wt 266.0 lb

## 2023-11-08 DIAGNOSIS — M25562 Pain in left knee: Secondary | ICD-10-CM | POA: Diagnosis not present

## 2023-11-08 DIAGNOSIS — G8929 Other chronic pain: Secondary | ICD-10-CM | POA: Diagnosis not present

## 2023-11-08 DIAGNOSIS — M25561 Pain in right knee: Secondary | ICD-10-CM | POA: Diagnosis not present

## 2023-11-08 MED ORDER — METHYLPREDNISOLONE ACETATE 80 MG/ML IJ SUSP
80.0000 mg | Freq: Once | INTRAMUSCULAR | Status: AC
  Administered 2023-11-08: 80 mg via INTRA_ARTICULAR

## 2023-11-08 NOTE — Progress Notes (Signed)
 Subjective:    Patient ID: Miguel Bean, male    DOB: 03/22/1961, 63 y.o.   MRN: [id number]  HPI 63 year old male who  has a past medical history of Anxiety and depression, Back pain, Insomnia, PONV (postoperative nausea and vomiting), and Thyroid  disease.  He presents to the office today for chronic bilateral knee pain.  He has known tricompartmental osteoarthritis and was advised that he does need knee replacement surgery done especially in the right knee.  Steroid injection was done roughly 9 months ago and had pretty good pain relief from these injections, his pain started to come back a few weeks ago.  He would like to have both of his knees injected again       Review of Systems See HPI   Past Medical History:  Diagnosis Date   Anxiety and depression    Back pain    Insomnia    PONV (postoperative nausea and vomiting)    Thyroid  disease     Social History   Socioeconomic History   Marital status: Married    Spouse name: Not on file   Number of children: Not on file   Years of education: Not on file   Highest education level: 12th grade  Occupational History   Not on file  Tobacco Use   Smoking status: Never   Smokeless tobacco: Never  Vaping Use   Vaping status: Never Used  Substance and Sexual Activity   Alcohol use: No   Drug use: No   Sexual activity: Yes  Other Topics Concern   Not on file  Social History Narrative   Not on file   Social Drivers of Health   Financial Resource Strain: Low Risk  (11/07/2023)   Overall Financial Resource Strain (CARDIA)    Difficulty of Paying Living Expenses: Not hard at all  Food Insecurity: No Food Insecurity (11/07/2023)   Hunger Vital Sign    Worried About Running Out of Food in the Last Year: Never true    Ran Out of Food in the Last Year: Never true  Transportation Needs: No Transportation Needs (11/07/2023)   PRAPARE - Administrator, Civil Service (Medical): No    Lack of Transportation  (Non-Medical): No  Physical Activity: Sufficiently Active (11/07/2023)   Exercise Vital Sign    Days of Exercise per Week: 5 days    Minutes of Exercise per Session: 30 min  Recent Concern: Physical Activity - Insufficiently Active (09/02/2023)   Exercise Vital Sign    Days of Exercise per Week: 3 days    Minutes of Exercise per Session: 30 min  Stress: No Stress Concern Present (11/07/2023)   [employer] of Occupational Health - Occupational Stress Questionnaire    Feeling of Stress: Only a little  Social Connections: Moderately Integrated (11/07/2023)   Social Connection and Isolation Panel    Frequency of Communication with Friends and Family: Once a week    Frequency of Social Gatherings with Friends and Family: More than three times a week    Attends Religious Services: 1 to 4 times per year    Active Member of [employer] or Organizations: No    Attends Engineer, structural: Not on file    Marital Status: Married  Recent Concern: Social Connections - Socially Isolated (09/02/2023)   Social Connection and Isolation Panel    Frequency of Communication with Friends and Family: Once a week    Frequency of Social Gatherings with Friends and  Family: Once a week    Attends Religious Services: Never    Active Member of Clubs or Organizations: No    Attends Engineer, structural: Not on file    Marital Status: Married  Intimate Partner Violence: Unknown (08/30/2021)   Received from [facility]   HITS    Physically Hurt: Not on file    Insult or Talk Down To: Not on file    Threaten Physical Harm: Not on file    Scream or Curse: Not on file    Past Surgical History:  Procedure Laterality Date   BACK SURGERY  05/28/2006   COLONOSCOPY     Endoscopic Sleeve Gastroplasty   04/2023   LUMBAR LAMINECTOMY  06/22/2011   Procedure: MICRODISCECTOMY LUMBAR LAMINECTOMY;  Surgeon: Adah Acron, MD;  Location: [facility] OR;  Service: Orthopedics;  Laterality: Left;  Left L5-S1  Microdiscectomy   VASECTOMY     96    Family History  Problem Relation Age of Onset   Colon polyps Father    Colon cancer Neg Hx    Esophageal cancer Neg Hx    Rectal cancer Neg Hx    Stomach cancer Neg Hx     No Known Allergies  Current Outpatient Medications on File Prior to Visit  Medication Sig Dispense Refill   atorvastatin  (LIPITOR) 10 MG tablet Take 1 tablet (10 mg total) by mouth daily. 90 tablet 3   levothyroxine  (SYNTHROID ) 125 MCG tablet TAKE 1 TABLET BY MOUTH EVERY DAY BEFORE BREAKFAST 90 tablet 1   pregabalin (LYRICA) 75 MG capsule Take by mouth.     sertraline  (ZOLOFT ) 100 MG tablet Take 2 tablets (200 mg total) by mouth daily. 60 tablet 11   traZODone  (DESYREL ) 100 MG tablet TAKE 1-2 TABLETS (100-200 MG TOTAL) BY MOUTH AT BEDTIME AS NEEDED FOR SLEEP. 180 tablet 1   zaleplon  (SONATA ) 10 MG capsule Take 1 capsule (10 mg total) by mouth at bedtime. May take another dose in the middle of the night if needed for mid-nocturnal awakening as long as he has 3-4 hours left to sleep. 30 capsule 5   No current facility-administered medications on file prior to visit.    BP 100/62   Pulse (!) 55   Temp 98.1 F (36.7 C) (Oral)   Ht 5' 9.25 (1.759 m)   Wt 266 lb (120.7 kg)   SpO2 97%   BMI 39.00 kg/m       Objective:   Physical Exam Vitals and nursing note reviewed.  Constitutional:      Appearance: Normal appearance.   Musculoskeletal:        General: Normal range of motion.   Skin:    General: Skin is warm and dry.   Neurological:     General: No focal deficit present.     Mental Status: He is alert and oriented to person, place, and time.   Psychiatric:        Mood and Affect: Mood normal.        Behavior: Behavior normal.        Thought Content: Thought content normal.        Judgment: Judgment normal.        Assessment & Plan:  1. Chronic pain of left knee (Primary) Discussed risks and benefits of corticosteroid injection and patient consented.   After prepping skin with betadine, injected 80 mg depomedrol and 2 cc of plain xylocaine  with 22 gauge one and one half inch needle using anterolateral approach  and pt tolerated well.  - methylPREDNISolone  acetate (DEPO-MEDROL ) injection 80 mg  2. Chronic pain of right knee Discussed risks and benefits of corticosteroid injection and patient consented.  After prepping skin with betadine, injected 80 mg depomedrol and 2 cc of plain xylocaine  with 22 gauge one and one half inch needle using anterolateral approach and pt tolerated well.  - methylPREDNISolone  acetate (DEPO-MEDROL ) injection 80 mg  .Zhaire Locker, NP

## 2023-11-10 ENCOUNTER — Other Ambulatory Visit: Payer: Self-pay | Admitting: Physician Assistant

## 2023-11-25 DIAGNOSIS — Z77098 Contact with and (suspected) exposure to other hazardous, chiefly nonmedicinal, chemicals: Secondary | ICD-10-CM | POA: Diagnosis not present

## 2023-12-14 ENCOUNTER — Other Ambulatory Visit: Payer: Self-pay | Admitting: Adult Health

## 2023-12-14 DIAGNOSIS — E039 Hypothyroidism, unspecified: Secondary | ICD-10-CM

## 2024-02-09 ENCOUNTER — Other Ambulatory Visit: Payer: Self-pay | Admitting: Physician Assistant

## 2024-02-09 DIAGNOSIS — G4700 Insomnia, unspecified: Secondary | ICD-10-CM

## 2024-02-27 ENCOUNTER — Ambulatory Visit: Admitting: Physician Assistant

## 2024-02-27 ENCOUNTER — Encounter: Payer: Self-pay | Admitting: Physician Assistant

## 2024-02-27 DIAGNOSIS — F411 Generalized anxiety disorder: Secondary | ICD-10-CM | POA: Diagnosis not present

## 2024-02-27 DIAGNOSIS — G4700 Insomnia, unspecified: Secondary | ICD-10-CM | POA: Diagnosis not present

## 2024-02-27 DIAGNOSIS — F3341 Major depressive disorder, recurrent, in partial remission: Secondary | ICD-10-CM

## 2024-02-27 MED ORDER — SERTRALINE HCL 100 MG PO TABS: 200.0000 mg | ORAL_TABLET | Freq: Every day | ORAL | 1 refills | Status: AC

## 2024-02-27 MED ORDER — ZALEPLON 10 MG PO CAPS: ORAL_CAPSULE | ORAL | 5 refills | Status: AC

## 2024-02-27 NOTE — Progress Notes (Signed)
 [facility] Med Check  Patient ID: Miguel Bean,  MRN: [id number]  PCP: Merna Huxley, NP  Date of Evaluation: 02/27/2024 Time spent:20 minutes  Chief Complaint:  Chief Complaint   Anxiety; Depression; Insomnia; Follow-up    HISTORY/CURRENT STATUS: HPI  for 6 month med check.  Doing well. His meds are still working as intended. Work is going well. Hopes to retire in the next few years.  No extreme sadness, tearfulness, or feelings of hopelessness.  Sleeps well most of the time.  Traz and Sonata  help when needed. ADLs and personal hygiene are normal.   Denies any changes in concentration, making decisions, or remembering things.  Appetite has not changed.  No mania, delirium, AH/VH.  No SI/HI.  Individual Medical History/ Review of Systems: Changes? :No      Past medications for mental health diagnoses include: Zoloft , Melatonin, Trazodone   Allergies: Patient has no known allergies.  Current Medications:  Current Outpatient Medications:    atorvastatin  (LIPITOR) 10 MG tablet, Take 1 tablet (10 mg total) by mouth daily., Disp: 90 tablet, Rfl: 3   levothyroxine  (SYNTHROID ) 125 MCG tablet, TAKE 1 TABLET BY MOUTH EVERY DAY BEFORE BREAKFAST, Disp: 90 tablet, Rfl: 1   pregabalin (LYRICA) 75 MG capsule, Take by mouth., Disp: , Rfl:    traZODone  (DESYREL ) 100 MG tablet, TAKE 1-2 TABLETS (100-200 MG TOTAL) BY MOUTH AT BEDTIME AS NEEDED FOR SLEEP., Disp: 180 tablet, Rfl: 1   sertraline  (ZOLOFT ) 100 MG tablet, Take 2 tablets (200 mg total) by mouth daily., Disp: 180 tablet, Rfl: 1   zaleplon  (SONATA ) 10 MG capsule, 1 po at bedtime prn sleep and may repeat 1 for midnocturnal awakening if there are 3-4 hours left to sleep., Disp: 60 capsule, Rfl: 5 Medication Side Effects: none  Family Medical/ Social History: Changes?   none  MENTAL HEALTH EXAM:  There were no vitals taken for this visit.There is no height or weight on file to calculate BMI.  General Appearance: Casual, Well Groomed,  and Obese  Eye Contact:  Good  Speech:  Clear and Coherent and Normal Rate  Volume:  Normal  Mood:  Euthymic  Affect:  Appropriate  Thought Process:  Goal Directed and Descriptions of Associations: Intact  Orientation:  Full (Time, Place, and Person)  Thought Content: Logical   Suicidal Thoughts:  No  Homicidal Thoughts:  No  Memory:  WNL  Judgement:  Good  Insight:  Good  Psychomotor Activity:  Normal  Concentration:  Concentration: Good and Attention Span: Good  Recall:  Good  Fund of Knowledge: Good  Language: Good  Assets:  Communication Skills Desire for Improvement Financial Resources/Insurance Housing Resilience Social Support Transportation Vocational/Educational  ADL's:  Intact  Cognition: WNL  Prognosis:  Good   DIAGNOSES:    ICD-10-CM   1. Generalized anxiety disorder  F41.1     2. Recurrent major depressive disorder, in partial remission  F33.41     3. Insomnia, unspecified type  G47.00 zaleplon  (SONATA ) 10 MG capsule     Receiving Psychotherapy: No   RECOMMENDATIONS:  PDMP reviewed.  Sonata  filled 02/11/2024.  Lyrica filled 01/30/2024. I provided approximately  20 minutes of face to face time during this encounter, including time spent before and after the visit in records review, medical decision making, counseling pertinent to today's visit, and charting.   Miguel Bean is doing well so no changes are needed.   Continue Zoloft  100 mg, 2 p.o. daily. Continue trazodone  100 mg, 1-2 p.o. nightly as needed sleep.  Continue Sonata  10 mg, 1 p.o. nightly as needed and may repeat 1 for mid nocturnal awakening as needed as long as he has 3 hours left to sleep. Return in 6 months.    Verneita Cooks, PA-C

## 2024-05-13 ENCOUNTER — Other Ambulatory Visit: Payer: Self-pay | Admitting: Physician Assistant

## 2024-05-13 DIAGNOSIS — G4700 Insomnia, unspecified: Secondary | ICD-10-CM

## 2024-05-14 NOTE — Telephone Encounter (Signed)
 Does have RF but they are at another pharmacy, need to verify which pharmacy RF should be sent to.

## 2024-05-14 NOTE — Telephone Encounter (Signed)
 Pt said he clicked on the wrong Rx in the app. He doesn't need a RF currently.

## 2024-05-27 ENCOUNTER — Other Ambulatory Visit: Payer: Self-pay | Admitting: Physician Assistant

## 2024-06-07 ENCOUNTER — Other Ambulatory Visit: Payer: Self-pay | Admitting: Adult Health

## 2024-06-07 DIAGNOSIS — E039 Hypothyroidism, unspecified: Secondary | ICD-10-CM

## 2024-08-27 ENCOUNTER — Ambulatory Visit: Admitting: Physician Assistant
